# Patient Record
Sex: Male | Born: 1940 | Race: Black or African American | Hispanic: No | Smoking: Former smoker
Health system: Southern US, Community
[De-identification: ages and names within clinical notes are randomized; demographics above are authoritative.]

## PROBLEM LIST (undated history)

## (undated) DIAGNOSIS — I4891 Unspecified atrial fibrillation: Secondary | ICD-10-CM

## (undated) DIAGNOSIS — I2699 Other pulmonary embolism without acute cor pulmonale: Secondary | ICD-10-CM

## (undated) DIAGNOSIS — J439 Emphysema, unspecified: Secondary | ICD-10-CM

## (undated) DIAGNOSIS — R7989 Other specified abnormal findings of blood chemistry: Secondary | ICD-10-CM

## (undated) DIAGNOSIS — I10 Essential (primary) hypertension: Secondary | ICD-10-CM

## (undated) DIAGNOSIS — M109 Gout, unspecified: Secondary | ICD-10-CM

## (undated) DIAGNOSIS — R778 Other specified abnormalities of plasma proteins: Secondary | ICD-10-CM

## (undated) DIAGNOSIS — R911 Solitary pulmonary nodule: Secondary | ICD-10-CM

## (undated) DIAGNOSIS — R55 Syncope and collapse: Secondary | ICD-10-CM

## (undated) HISTORY — DX: Solitary pulmonary nodule: R91.1

## (undated) HISTORY — DX: Emphysema, unspecified: J43.9

## (undated) HISTORY — DX: Syncope and collapse: R55

## (undated) HISTORY — DX: Other specified abnormalities of plasma proteins: R77.8

## (undated) HISTORY — DX: Unspecified atrial fibrillation: I48.91

## (undated) HISTORY — PX: KNEE SURGERY: SHX244

## (undated) HISTORY — DX: Other specified abnormal findings of blood chemistry: R79.89

## (undated) HISTORY — DX: Other pulmonary embolism without acute cor pulmonale: I26.99

---

## 1991-05-01 HISTORY — PX: BACK SURGERY: SHX140

## 2012-12-31 ENCOUNTER — Encounter (HOSPITAL_COMMUNITY): Payer: Self-pay

## 2012-12-31 ENCOUNTER — Emergency Department (HOSPITAL_COMMUNITY): Payer: Medicare Other

## 2012-12-31 ENCOUNTER — Inpatient Hospital Stay (HOSPITAL_COMMUNITY)
Admission: EM | Admit: 2012-12-31 | Discharge: 2013-01-02 | DRG: 308 | Disposition: A | Discharge status: Home / Self Care | Payer: Medicare Other | Attending: Internal Medicine | Admitting: Internal Medicine

## 2012-12-31 DIAGNOSIS — Z87891 Personal history of nicotine dependence: Secondary | ICD-10-CM

## 2012-12-31 DIAGNOSIS — M109 Gout, unspecified: Secondary | ICD-10-CM | POA: Diagnosis present

## 2012-12-31 DIAGNOSIS — I2699 Other pulmonary embolism without acute cor pulmonale: Secondary | ICD-10-CM | POA: Diagnosis present

## 2012-12-31 DIAGNOSIS — Z79899 Other long term (current) drug therapy: Secondary | ICD-10-CM

## 2012-12-31 DIAGNOSIS — D491 Neoplasm of unspecified behavior of respiratory system: Secondary | ICD-10-CM | POA: Diagnosis present

## 2012-12-31 DIAGNOSIS — I4891 Unspecified atrial fibrillation: Principal | ICD-10-CM | POA: Diagnosis present

## 2012-12-31 DIAGNOSIS — Z7982 Long term (current) use of aspirin: Secondary | ICD-10-CM

## 2012-12-31 DIAGNOSIS — Z23 Encounter for immunization: Secondary | ICD-10-CM

## 2012-12-31 DIAGNOSIS — D381 Neoplasm of uncertain behavior of trachea, bronchus and lung: Secondary | ICD-10-CM | POA: Diagnosis present

## 2012-12-31 DIAGNOSIS — R7989 Other specified abnormal findings of blood chemistry: Secondary | ICD-10-CM

## 2012-12-31 DIAGNOSIS — J439 Emphysema, unspecified: Secondary | ICD-10-CM

## 2012-12-31 DIAGNOSIS — R911 Solitary pulmonary nodule: Secondary | ICD-10-CM

## 2012-12-31 DIAGNOSIS — J438 Other emphysema: Secondary | ICD-10-CM | POA: Diagnosis present

## 2012-12-31 DIAGNOSIS — R55 Syncope and collapse: Secondary | ICD-10-CM | POA: Diagnosis present

## 2012-12-31 DIAGNOSIS — I10 Essential (primary) hypertension: Secondary | ICD-10-CM | POA: Diagnosis present

## 2012-12-31 HISTORY — DX: Gout, unspecified: M10.9

## 2012-12-31 HISTORY — DX: Essential (primary) hypertension: I10

## 2012-12-31 LAB — CBC
MCV: 82.5 fL (ref 78.0–100.0)
Platelets: 256 10*3/uL (ref 150–400)
RBC: 5.04 MIL/uL (ref 4.22–5.81)
WBC: 15.2 10*3/uL — ABNORMAL HIGH (ref 4.0–10.5)

## 2012-12-31 LAB — TSH: TSH: 2.398 u[IU]/mL (ref 0.350–4.500)

## 2012-12-31 LAB — MAGNESIUM: Magnesium: 2.2 mg/dL (ref 1.5–2.5)

## 2012-12-31 LAB — RAPID URINE DRUG SCREEN, HOSP PERFORMED
Benzodiazepines: NOT DETECTED
Cocaine: NOT DETECTED

## 2012-12-31 LAB — POCT I-STAT TROPONIN I: Troponin i, poc: 0.34 ng/mL (ref 0.00–0.08)

## 2012-12-31 LAB — BASIC METABOLIC PANEL
CO2: 24 mEq/L (ref 19–32)
Chloride: 110 mEq/L (ref 96–112)
Sodium: 145 mEq/L (ref 135–145)

## 2012-12-31 LAB — TROPONIN I: Troponin I: 0.6 ng/mL (ref <0.30)

## 2012-12-31 LAB — PROTIME-INR: INR: 1.01 (ref 0.00–1.49)

## 2012-12-31 MED ORDER — MULTI-VITAMIN/MINERALS PO TABS: 2.0000 | ORAL_TABLET | Freq: Every day | ORAL | Status: DC

## 2012-12-31 MED ORDER — HEPARIN (PORCINE) IN NACL 100-0.45 UNIT/ML-% IJ SOLN
1600.0000 [IU]/h | INTRAMUSCULAR | Status: DC
  Administered 2012-12-31: 1400 [IU]/h via INTRAVENOUS
  Administered 2013-01-01 (×2): 1600 [IU]/h via INTRAVENOUS
  Filled 2012-12-31 (×4): qty 250

## 2012-12-31 MED ORDER — SODIUM CHLORIDE 0.9 % IJ SOLN
3.0000 mL | Freq: Two times a day (BID) | INTRAMUSCULAR | Status: DC
  Administered 2013-01-01: 3 mL via INTRAVENOUS

## 2012-12-31 MED ORDER — LISINOPRIL 10 MG PO TABS
10.0000 mg | ORAL_TABLET | Freq: Every day | ORAL | Status: DC
  Administered 2013-01-01 – 2013-01-02 (×2): 10 mg via ORAL
  Filled 2012-12-31 (×2): qty 1

## 2012-12-31 MED ORDER — IOHEXOL 300 MG/ML  SOLN
25.0000 mL | INTRAMUSCULAR | Status: AC
  Administered 2012-12-31 (×2): 25 mL via ORAL

## 2012-12-31 MED ORDER — ONDANSETRON HCL 4 MG/2ML IJ SOLN
4.0000 mg | Freq: Three times a day (TID) | INTRAMUSCULAR | Status: AC | PRN
Start: 2012-12-31 — End: 2013-01-01

## 2012-12-31 MED ORDER — HYDROMORPHONE HCL PF 1 MG/ML IJ SOLN: 1.0000 mg | INTRAMUSCULAR | Status: AC | PRN

## 2012-12-31 MED ORDER — ADULT MULTIVITAMIN W/MINERALS CH
1.0000 | ORAL_TABLET | Freq: Every day | ORAL | Status: DC
  Administered 2013-01-01 – 2013-01-02 (×2): 1 via ORAL
  Filled 2012-12-31 (×2): qty 1

## 2012-12-31 MED ORDER — DILTIAZEM HCL 30 MG PO TABS
30.0000 mg | ORAL_TABLET | Freq: Four times a day (QID) | ORAL | Status: DC
  Administered 2012-12-31 – 2013-01-01 (×2): 30 mg via ORAL
  Filled 2012-12-31 (×6): qty 1

## 2012-12-31 MED ORDER — IOHEXOL 350 MG/ML SOLN
100.0000 mL | Freq: Once | INTRAVENOUS | Status: AC | PRN
  Administered 2012-12-31: 100 mL via INTRAVENOUS

## 2012-12-31 MED ORDER — HEPARIN BOLUS VIA INFUSION
4500.0000 [IU] | Freq: Once | INTRAVENOUS | Status: AC
  Administered 2012-12-31: 4500 [IU] via INTRAVENOUS
  Filled 2012-12-31: qty 4500

## 2012-12-31 MED ORDER — ASPIRIN EC 325 MG PO TBEC
325.0000 mg | DELAYED_RELEASE_TABLET | Freq: Every day | ORAL | Status: DC
  Administered 2012-12-31: 325 mg via ORAL
  Filled 2012-12-31 (×2): qty 1

## 2012-12-31 NOTE — ED Notes (Signed)
Report called to the floor.

## 2012-12-31 NOTE — ED Notes (Signed)
The pt just returned from xray 

## 2012-12-31 NOTE — ED Notes (Signed)
The pt has gone to xray 

## 2012-12-31 NOTE — ED Notes (Signed)
Patient transported to CT 

## 2012-12-31 NOTE — ED Notes (Addendum)
Per [facility], pt at work, got dehydrated, found in Breakroom unresponsive. Diaphoretic with BP 70/40, absent radial pulses, HR > 150. Afib on the monitor. 18g to RAC and cardizem gtt started. 1 Liter infused. Pt alert and oriented at this time. Also given 20 mg cardizem bolus to help rate and BP. Cardizem gtt running at 5mg / hr

## 2012-12-31 NOTE — Progress Notes (Signed)
ANTICOAGULATION CONSULT NOTE - Initial Consult  Pharmacy Consult for heparin Indication: pulmonary embolus  No Known Allergies  Patient Measurements:   Heparin Dosing Weight: 90.9kg  Vital Signs: Temp: 98.5 F (36.9 C) (09/03 1747) Temp src: Oral (09/03 1529) BP: 148/73 mmHg (09/03 2005) Pulse Rate: 80 (09/03 2005)  Labs:  Recent Labs  12/31/12 1652  HGB 14.4  HCT 41.6  PLT 256  CREATININE 1.29    CrCl is unknown because there is no height on file for the current visit.   Medical History: Past Medical History  Diagnosis Date  . Hypertension   . Gout     Medications:  Infusions:  . heparin    . heparin      Assessment: 72 yom presented to the ED with syncope. CT demonstrated a PE. Also lung masses. To start IV heparin for anticoagulation. Baseline CBC is WNL. He is not on any anticoagulants PT. Dosing based on patients stated height/weight of 71in/200lbs but pt has states he hasn't weight himself in awhile.   Goal of Therapy:  Heparin level 0.3-0.7 units/ml Monitor platelets by anticoagulation protocol: Yes   Plan:  1. Heparin bolus 4500 units IV x 1 2. Heparin gtt 1400 units/hr 3. Check an 8 hour heparin level 4. Daily heparin level and CBC 5. F/u inpatient measured weight 6. F/u start of oral anticoagulation  Corinn Stoltzfus, Drake Leach 12/31/2012,10:02 PM

## 2012-12-31 NOTE — ED Provider Notes (Signed)
CSN: [id number]     Arrival date & time 12/31/12  1457 History   None    No chief complaint on file.  (Consider location/radiation/quality/duration/timing/severity/associated sxs/prior Treatment) Patient is a 72 y.o. male presenting with syncope. The history is provided by the patient. No language interpreter was used.  Loss of Consciousness Episode history:  Single Most recent episode:  Today Timing:  Rare Progression:  Resolved Chronicity:  New Context: dehydration   Witnessed: yes   Relieved by:  Nothing Worsened by:  Nothing tried Ineffective treatments:  None tried Associated symptoms: no chest pain, no fever, no headaches, no nausea, no recent fall, no seizures, no shortness of breath and no vomiting   Associated symptoms comment:  New onset afib Risk factors: no coronary artery disease and no seizures     No past medical history on file. No past surgical history on file. No family history on file. History  Substance Use Topics  . Smoking status: Not on file  . Smokeless tobacco: Not on file  . Alcohol Use: Not on file    Review of Systems  Constitutional: Negative for fever.  HENT: Negative for congestion, sore throat and rhinorrhea.   Respiratory: Negative for cough and shortness of breath.   Cardiovascular: Positive for syncope. Negative for chest pain.  Gastrointestinal: Negative for nausea, vomiting, abdominal pain and diarrhea.  Genitourinary: Negative for dysuria and hematuria.  Skin: Negative for rash.  Neurological: Positive for syncope. Negative for seizures, light-headedness and headaches.  All other systems reviewed and are negative.    Allergies  Review of patient's allergies indicates not on file.  Home Medications  No current outpatient prescriptions on file. BP 103/63  Pulse 92  Temp(Src) 98.5 F (36.9 C) (Oral)  Resp 16  SpO2 99% Physical Exam  Nursing note and vitals reviewed. Constitutional: He is oriented to person, place, and time.  He appears well-developed and well-nourished.  HENT:  Head: Normocephalic and atraumatic.  Right Ear: External ear normal.  Left Ear: External ear normal.  Eyes: EOM are normal.  Neck: Normal range of motion. Neck supple.  Cardiovascular: Normal heart sounds and intact distal pulses.  Exam reveals no gallop and no friction rub.   No murmur heard. irreg irreg  Pulmonary/Chest: Effort normal and breath sounds normal. No respiratory distress. He has no wheezes. He has no rales. He exhibits no tenderness.  Abdominal: Soft. Bowel sounds are normal. He exhibits no distension. There is no tenderness. There is no rebound.  Musculoskeletal: Normal range of motion. He exhibits no edema and no tenderness.  Lymphadenopathy:    He has no cervical adenopathy.  Neurological: He is alert and oriented to person, place, and time. He has normal reflexes. He displays normal reflexes. No cranial nerve deficit. He exhibits normal muscle tone. Coordination normal.  Skin: Skin is warm. No rash noted.  Psychiatric: He has a normal mood and affect. His behavior is normal.    ED Course  Procedures (including critical care time) Labs Review Labs Reviewed  CBC - Abnormal; Notable for the following:    WBC 15.2 (*)    All other components within normal limits  BASIC METABOLIC PANEL - Abnormal; Notable for the following:    GFR calc non Af Amer 55 (*)    GFR calc Af Amer 63 (*)    All other components within normal limits  TROPONIN I - Abnormal; Notable for the following:    Troponin I 0.60 (*)    All other components  within normal limits  POCT I-STAT TROPONIN I - Abnormal; Notable for the following:    Troponin i, poc 0.11 (*)    All other components within normal limits  POCT I-STAT TROPONIN I - Abnormal; Notable for the following:    Troponin i, poc 0.34 (*)    All other components within normal limits  MAGNESIUM  TSH  URINE RAPID DRUG SCREEN (HOSP PERFORMED)  PROTIME-INR  HEPARIN LEVEL  (UNFRACTIONATED)  CBC  CBC  BASIC METABOLIC PANEL  TROPONIN I  TROPONIN I  TSH   Imaging Review Dg Chest 2 View  12/31/2012   *RADIOLOGY REPORT*  Clinical Data: The atrial fibrillation.  Diaphoresis  CHEST - 2 VIEW  Comparison: None.  Findings: Right lung is clear.  3.4 cm mass like opacity overlies the left midlung. The cardiopericardial silhouette is within normal limits for size. Imaged bony structures of the thorax are intact.  IMPRESSION: 3.4 cm mass-like opacity overlying the left mid lung, concerning for neoplasm.  CT chest recommended to further evaluate.  I personally called the results of this study to Dr. Modesto Charon at approximately 1600 hours on 12/31/2012.   Original Report Authenticated By: Kennith Center, M.D.    MDM  No diagnosis found. 2:57 PM Pt is a 72 y.o. male with pertinent PMHX of HTN who presents to the ED with syncopal episode and new onset afib. Pt presents with syncopal episode at around [facility]. Preceding aura:. Not similar to previous episodes. Pt had no symptoms prior to syncopal episode. Pt denies chest pain, palpitations,lightheadedness. Pt denies focal neurologic deficits.  Given 20mg  Diltiazem prior to arrival and started on drip. Denies illicit drug abuse. Denies stimulant use. 2-3 caffeinated beverages a day. No stimulant use. No family history of sudden death or arrythmia. No recent illness. Used to smoke.  On Exam: AFVSS, irreg irreg rhythm Neurologic exam: Fundoscopic exam: CN I-XII: grossly intact, Sensation: normal in upper and lower extremities, Strength 5/5 in both upper and lower extremities, Coordination intact. Gait normal. Pt's HR<100 will stop drip. Pressure stable. Plan for EKG, CXr, CBC, BMP, istat troponin, UDS  EKG personally reviewed by myself showed afib, incomplete RBBB, LAFB Rate of 77, PR NAms, QRS QT/QTC 358/427ms, left axis, without evidence of new ischemia. No Comparison, indication: new onset afib  CXR PA/LAT for new on set afib per my read  showed well rounded mass in left lung suspicious for malignancy given history of tobacco abuse, otherwise no cardiomegaly no ptx or pneumonia. Given suspicion for malignancy, high risk for PE which is possible cause of syncope. Will obtain CTA PE study if GFR allows  Pt noted to be back into sinus rhythm, Will have cardiology come to evaluate pt for new onset afib.  Review of labs: istat troponin mild elevation. UDS negative. CBC showed leukocytosis, H&H 14.4/41.6. BMP showed no electrolyte abnormalities. Will obtain CTA Pe study and CT abdomen pelvis w contrast. Mag 2.2.   The patient appears reasonably stabilized for admission considering the current resources, flow, and capabilities available in the ED at this time, and I doubt any other [facility] requiring further screening and/or treatment in the ED prior to admission.   Plan for admission to hospitalist for further evaluation and management. PT transferred to step down VSS.  CTA PE study showed small pulmonary embolus to left upper lobe. 2 spiculated lesions in the left upper lobe suspicious for malignancy. CT abdomen pelvis w contrast showed small lucent lesions in bony pelvis, lymphadenopathy in the gastrohepatic ligament,  cholelithiasis and left renal cyst  Second iStat troponin 0.34. Plan for admission to hospitalist for further cancer work up with cardiology following for new onset afib and NSTEMI.  The patient appears reasonably stabilized for admission considering the current resources, flow, and capabilities available in the ED at this time, and I doubt any other [facility] requiring further screening and/or treatment in the ED prior to admission.   Plan for admission to Hospitalist for further evaluation and management. PT transferred to step down VSS.  Labs, EKG and imaging reviewed by myself and considered in medical decision making if ordered.  Imaging interpreted by radiology. Pt was discussed with my attending, Dr. Erlene Quan, MD 01/01/13 [phone]

## 2012-12-31 NOTE — Progress Notes (Signed)
Pt transfered from ED with RN, on monitor. Pt Ax4, able to move all ext, and VSS. Pt on heparin gtt. Will continue to monitor.

## 2012-12-31 NOTE — ED Notes (Signed)
Cards pa here to see 

## 2012-12-31 NOTE — ED Notes (Signed)
Patient returned from CT and placed back on cardiac monitor.

## 2012-12-31 NOTE — ED Notes (Signed)
The pts son Randy Mcgee number [phone].  Other numbers attached to the pts chart

## 2012-12-31 NOTE — ED Notes (Signed)
Dr Jens Som in to see the pt

## 2012-12-31 NOTE — H&P (Signed)
[facility] History and Physical  Randy Mcgee ZOX:[id number] DOB: 11-Mar-1941 DOA: 12/31/2012  Referring physician: ED PCP: Pcp Not In System   Chief Complaint: Syncope  HPI: Randy Mcgee is a 72 y.o. male who presents after an episode of syncope.  He was in his usual state of health today, unloading a tractor-trailer, and the next thing he knew, he woke up in the ambulance on the way to the hospital.  There was no prodrome other than perhaps a small amount of dizziness earlier today.  EMS found him to be in A.fib with RVR, SBP of 70, after a liter of NS and Cardizem 20mg  x1, he spontaneously converted upon arrival to the ER.  He has no history of A.Fib in the past.  In the ED, in addition to his A.Fib (for which cards has seen the patient), work up has also demonstrated: mildly elevated troponin of 0.11 which has since elevated to 0.34, 2 lung masses in the left upper lobe which appear to be spiculated and malignant appearing, and incidental findings of small PE on the CT scan which was performed to visualize the lung masses.  Hospitalist is admitting.  Review of Systems: 12 systems reviewed and otherwise negative.  Past Medical History  Diagnosis Date  . Hypertension   . Gout    Past Surgical History  Procedure Laterality Date  . Back surgery  1993  . Knee surgery Bilateral    Social History:  reports that he has quit smoking. He does not have any smokeless tobacco history on file. He reports that he does not drink alcohol or use illicit drugs.   No Known Allergies  History reviewed. No pertinent family history.  Prior to Admission medications   Medication Sig Start Date End Date Taking? Authorizing Provider  lisinopril (PRINIVIL,ZESTRIL) 10 MG tablet Take 10 mg by mouth daily.   Yes Historical Provider, MD  Multiple Vitamins-Minerals (MULTIVITAMIN WITH MINERALS) tablet Take 2 tablets by mouth daily.   Yes Historical Provider, MD   Physical Exam: Filed Vitals:    12/31/12 2005  BP: 148/73  Pulse: 80  Temp:   Resp: 18    General:  NAD, resting comfortably in bed Eyes: PEERLA EOMI ENT: mucous membranes moist Neck: supple w/o JVD Cardiovascular: RRR w/o MRG Respiratory: CTA B Abdomen: soft, nt, nd, bs+ Skin: no rash nor lesion Musculoskeletal: MAE, full ROM all 4 extremities Psychiatric: normal tone and affect Neurologic: AAOx3, grossly non-focal  Labs on Admission:  Basic Metabolic Panel:  Recent Labs Lab 12/31/12 1652  NA 145  K 4.2  CL 110  CO2 24  GLUCOSE 99  BUN 16  CREATININE 1.29  CALCIUM 9.9  MG 2.2   Liver Function Tests: No results found for this basename: AST, ALT, ALKPHOS, BILITOT, PROT, ALBUMIN,  in the last 168 hours No results found for this basename: LIPASE, AMYLASE,  in the last 168 hours No results found for this basename: AMMONIA,  in the last 168 hours CBC:  Recent Labs Lab 12/31/12 1652  WBC 15.2*  HGB 14.4  HCT 41.6  MCV 82.5  PLT 256   Cardiac Enzymes: No results found for this basename: CKTOTAL, CKMB, CKMBINDEX, TROPONINI,  in the last 168 hours  BNP (last 3 results) No results found for this basename: PROBNP,  in the last 8760 hours CBG: No results found for this basename: GLUCAP,  in the last 168 hours  Radiological Exams on Admission: Dg Chest 2 View  12/31/2012   *RADIOLOGY REPORT*  Clinical Data: The atrial fibrillation.  Diaphoresis  CHEST - 2 VIEW  Comparison: None.  Findings: Right lung is clear.  3.4 cm mass like opacity overlies the left midlung. The cardiopericardial silhouette is within normal limits for size. Imaged bony structures of the thorax are intact.  IMPRESSION: 3.4 cm mass-like opacity overlying the left mid lung, concerning for neoplasm.  CT chest recommended to further evaluate.  I personally called the results of this study to Dr. Modesto Charon at approximately 1600 hours on 12/31/2012.   Original Report Authenticated By: Kennith Center, M.D.   Ct Angio Chest Pe W/cm &/or Wo  Cm  12/31/2012   *RADIOLOGY REPORT*  Clinical Data: Diaphoresis with hypotension.  Question pulmonary embolus.  CT ANGIOGRAPHY CHEST  Technique:  Multidetector CT imaging of the chest using the standard protocol during bolus administration of intravenous contrast. Multiplanar reconstructed images including MIPs were obtained and reviewed to evaluate the vascular anatomy.  Contrast: OMNIPAQUE IOHEXOL 350 MG/ML SOLN  Comparison: None.  Findings: Small pulmonary emboli are seen in segmental and subsegmental pulmonary arteries to the lingula no evidence for pulmonary embolic disease in the right lung.  No thoracic aortic aneurysm.  No evidence for a dissection flap in the thoracic aorta.  No axillary lymphadenopathy.  No mediastinal lymphadenopathy.  No bulky lymphadenopathy in either hilum although there is some lymphoid tissue noted in the left hilum.  Lung windows reveal a 2.0 cm spiculated nodule in the anteromedial left upper lobe.  There may be a tiny focus of cavitation within this nodule.  A second 3.1 x 2.6 cm pulmonary spiculated mass is identified in the posterior left upper lobe.  Emphysema is noted in the upper lobes.  There is some compressive atelectasis in the dependent lower lobes bilaterally.  Bone windows reveal no worrisome lytic or sclerotic osseous lesions.  IMPRESSION: Tiny nonocclusive pulmonary embolus and branches of the segmental and subsegmental pulmonary artery to the left upper lobe.  Two spiculated lesions in the left upper lobe measuring 2.0 and 3.1 cm in maximum dimensions, respectively.  Imaging features are highly suspicious for neoplasm although it is unclear whether they represent  synchronous lung primaries or possible metastatic involvement.  PET CT may prove helpful to further evaluate.  Critical Value/emergent results were called by telephone at the time of interpretation on 12/31/2012 at 2113 to Dr. Modesto Charon, who verbally acknowledged these results.   Original Report  Authenticated By: Kennith Center, M.D.   Ct Abdomen Pelvis W Contrast  12/31/2012   *RADIOLOGY REPORT*  Clinical Data: Left lung mass.  Evaluate for metastatic disease.  CT ABDOMEN AND PELVIS WITH CONTRAST  Technique:  Multidetector CT imaging of the abdomen and pelvis was performed following the standard protocol during bolus administration of intravenous contrast.  Contrast: OMNIPAQUE IOHEXOL 350 MG/ML SOLN  Comparison: None.  Findings: The liver and spleen are normal.  Stomach, duodenum, pancreas, and adrenal glands are unremarkable.  2.7 cm stone is identified in the fundus of the gallbladder.  Right kidney is unremarkable.  2.6 cm cyst is identified in the upper pole of the left kidney with a second smaller 1.2 cm cyst visible.  No abdominal aortic aneurysm although atherosclerotic calcification is noted in the wall of the abdominal aorta.  The 1.7 cm gastrohepatic lymph node is mildly enlarged.  No hepatoduodenal ligament lymphadenopathy.  No free fluid in the abdomen.  Imaging through the pelvis shows no free intraperitoneal fluid. There is no pelvic sidewall lymphadenopathy.  Bladder is unremarkable.  Prostate gland is mildly enlarged.  No substantial diverticular change in the colon.  There is no colonic diverticulitis.  The terminal ileum is normal.  The appendix is normal.  Bilateral inguinal hernias contain only fat.  Multiple small lucent lesions are seen within the bony anatomic pelvis.  Advanced degenerative changes are seen at L4-5 and L5-S1.  IMPRESSION: Borderline lymphadenopathy in the gastrohepatic ligament.  Small lucent lesions in the bony anatomic pelvis.  While nonspecific, metastatic involvement is not excluded.  Cholelithiasis.  Left renal cysts.   Original Report Authenticated By: Kennith Center, M.D.    EKG: Independently reviewed.  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 Neoplasm of LUL of lung - 2 masses, unclear wether these are 2 concurrent primaries vs local metastasis, patient likely will need biopsy / resection, likely needs CTS evaluation tomorrow vs IR for biopsy. 2. A.Fib - currently in NSR, on heparin gtt for PEs, have not yet ordered coumadin on this patient since it is likely he will be undergoing procedure to biopsy lung mass(s). 3. PE - see #2 above, very small clot burden 4. Elevated troponin - cards on board, checking serial trops, cards to determine if patient needs heart cath (they said he likely would in their note if this continues to elevate).    Code Status: Full Code (must indicate code status--if unknown or must be presumed, indicate so) Family Communication: No family in room (indicate person spoken with, if applicable, with phone number if by telephone) Disposition Plan: Admit to inpatient (indicate anticipated LOS)  Time spent: 70 min  GARDNER, JARED M. [facility] Pager [phone]  If 7PM-7AM, please contact night-coverage [email or web address] Password [facility] 12/31/2012, 10:37 PM

## 2012-12-31 NOTE — ED Notes (Signed)
cardizem drip d/c per dr [name]

## 2012-12-31 NOTE — ED Notes (Signed)
No pain nsr on the monitor.  Waiting for the admitting doctor to arrive

## 2012-12-31 NOTE — ED Notes (Signed)
The pt has been waiting for a c-t told  bt the ed res.  labwork had to come back before he could be scanned.  Son at his bedside the pt is hungry.  He reports that he only eats x 2 per day

## 2012-12-31 NOTE — ED Notes (Signed)
The pt just returned from xray.  No pain or discooomfort

## 2012-12-31 NOTE — ED Notes (Signed)
The pt is still drinking oral contrast.  He is c/o being cold room temp adjusted

## 2012-12-31 NOTE — Consult Note (Signed)
CARDIOLOGY CONSULT NOTE   Patient ID: Randy Mcgee MRN: [id number] DOB/AGE: [birth year] 72 y.o.  Admit date: 12/31/2012  Primary Physician   Randy Mcgee in [facility] Primary Cardiologist   Randy Mcgee   Syncope, PAF/RVR, anticoagulation, possible pre-op eval, elevated enzymes  Randy Mcgee is a 72 y.o. male with no history of CAD. He was in his usual state of health today and was working, unloading a Randy Mcgee. He was doing pretty well and then woke up in the ambulance.  Earlier today, he had noticed some dizziness when he bent over and came back up. When he passed out, he bent over and had the dizziness but no other prodrome and no presyncope. He was not aware he was going to pass out. EMS found him to be in atrial fibrillation with RVR, SBP 70. He was given a liter of saline and Cardizem 20 mg IV x 1. Upon arrival to the ER, he spontaneously converted to sinus rhythm. He is currently resting comfortably.   He had no symptoms or signs before the syncope except diaphoresis and his feet were tired. When he woke up, he was still diaphoretic but felt fairly normal. He never had chest pain, palpitations, SOB. He had no awareness of tachycardia. Today, he had 2 cups of coffee, 2 -16 oz sodas and 1 glass of water. He has had rare episodes of brief palpitations, no symptoms with them, none recently. He is not aware of any bleeding from any source. He has no recent illnesses or problems.   Past Medical History  Diagnosis Date  . Hypertension   . Gout     Past Surgical History  Procedure Laterality Date  . Back surgery  1993  . Knee surgery Bilateral     No Known Allergies  I have reviewed the patient's current medications . [COMPLETED] iohexol  25 mL Oral Q1 Hr x 2   Medication Sig  lisinopril (PRINIVIL,ZESTRIL) 10 MG tablet Take 10 mg by mouth daily.  Multiple Vitamins-Minerals (MULTIVITAMIN WITH MINERALS) tablet Take 2 tablets by mouth daily.    History    Social History  . Marital Status: Widowed    Spouse Name: N/A    Number of Children: N/A  . Years of Education: N/A   Occupational History  . Company secretary now, Retired from [employer]    Social History Main Topics  . Smoking status: Former Smoker -- 0.50 packs/day for 45 years  . Smokeless tobacco: Not on file     Comment: Quit 2011, less than 1/2 PPD  . Alcohol Use: No     Comment: None in > 25 years  . Drug Use: No  . Sexual Activity: Not on file   Other Topics Concern  . Not on file   Social History Narrative  . Pt lives with youngest son and niece.     Family Status  Relation Status Death Age  . Brother Deceased 52    Cancer  . Father Deceased 27    Old age  . Mother Deceased 61    Rare form of adult-onset Leukemia   . Other      Uncle with Prostate CA    ROS: Occasional GI discomfort from specific foods. He takes vitamins and BP meds daily. He drinks juice and eats well. He generally does not get sick. Full 14 point review of systems complete and found to be negative unless listed above.  Physical Exam: Blood pressure 130/61, pulse 74, temperature  98.5 F (36.9 C), temperature source Oral, resp. rate 18, SpO2 100.00%.  General: Well developed, well nourished, male in no acute distress Head: Eyes PERRLA, No xanthomas.   Normocephalic and atraumatic, oropharynx without edema or exudate.  Lungs: CTA with normal expansion Heart: HRRR S1 S2, no rub/gallop, murmur. pulses are 2+ all 4 extrem.   Neck: No carotid bruits. No lymphadenopathy.  JVD not elevated. Abdomen: Bowel sounds present, abdomen soft and non-tender without masses or hernias noted. Msk:  No spine or cva tenderness. No weakness, no joint deformities or effusions. Extremities: No clubbing or cyanosis. No edema.  Neuro: Alert and oriented X 3. No focal deficits noted. Psych:  Good affect, responds appropriately Skin: No rashes or lesions noted.  Labs:   Lab Results  Component Value  Date   WBC 15.2* 12/31/2012   HGB 14.4 12/31/2012   HCT 41.6 12/31/2012   MCV 82.5 12/31/2012   PLT 256 12/31/2012     Recent Labs Lab 12/31/12 1652  NA 145  K 4.2  CL 110  CO2 24  BUN 16  CREATININE 1.29  CALCIUM 9.9  GLUCOSE 99   Magnesium  Date Value Range Status  12/31/2012 2.2  1.5 - 2.5 mg/dL Final    Recent Labs  16/10/96 1709  TROPIPOC 0.11*    ECG:   EMS rhythm strips with   Radiology:  Dg Chest 2 View  12/31/2012   *RADIOLOGY REPORT*  Clinical Data: The atrial fibrillation.  Diaphoresis  CHEST - 2 VIEW  Comparison: None.  Findings: Right lung is clear.  3.4 cm mass like opacity overlies the left midlung. The cardiopericardial silhouette is within normal limits for size. Imaged bony structures of the thorax are intact.  IMPRESSION: 3.4 cm mass-like opacity overlying the left mid lung, concerning for neoplasm.  CT chest recommended to further evaluate.  I personally called the results of this study to Randy. Modesto Charon at approximately 1600 hours on 12/31/2012.   Original Report Authenticated By: Kennith Center, M.D.    ASSESSMENT AND PLAN:    As above, patient seen and examined. Briefly he is a 72 year old male with atrial fibrillation. He has no prior cardiac history and typically does not have dyspnea on exertion, orthopnea, PND, pedal edema, palpitations, syncope or exertional chest pain. The patient states he was working hard today unloading a truck. He was sweating profusely. He had 2 episodes of dizziness after bending over and then standing up. He then over and stood up and recalls nothing until being placed in an ambulance. He had no chest pain, palpitations, nausea or dyspnea prior to his syncopal episode. No seizure activity, incontinence. Troponin I 0.11. Electrocardiogram shows atrial fibrillation, RV conduction delay and left axis deviation. Chest x-ray shows lung mass. 1 syncope-patient symptoms sound to be orthostatic mediated. Plan to admit and follow on telemetry. Check  echocardiogram for LV function. If LV function normal would not plan further workup unless he has recurrent symptoms in the future. 2 atrial fibrillation-the patient was in atrial fibrillation on presentation. Check echocardiogram for LV function and TSH. His CHADS score is 1 for hypertension. If LV function is normal I would favor aspirin long-term. I would be hesitant to anticoagulate at this point given lung mass on chest x-ray and recent syncopal episode. We'll treat with Cardizem for rate control (Began 30 mg by mouth every 6 hours and transition to Cardizem CD tomorrow morning if heart rate and blood pressure stable). Note he did convert to sinus rhythm in  the emergency room. 3 hypertension-monitor blood pressure and adjust medications as needed. 4 lung mass-there is concern for lung cancer. A chest CT has been ordered. I will leave this issue to primary care. 5 elevated troponin I-troponin is mildly elevated. This may be from his atrial fibrillation. Would treat with aspirin. Continue to cycle enzymes. If there is further trend up he may need cardiac catheterization. Otherwise I would plan nuclear study for risk stratification.  Signed: Olga Millers MD

## 2013-01-01 ENCOUNTER — Encounter (HOSPITAL_COMMUNITY): Payer: Self-pay | Admitting: Adult Health

## 2013-01-01 DIAGNOSIS — J439 Emphysema, unspecified: Secondary | ICD-10-CM

## 2013-01-01 DIAGNOSIS — I4891 Unspecified atrial fibrillation: Secondary | ICD-10-CM

## 2013-01-01 DIAGNOSIS — R55 Syncope and collapse: Secondary | ICD-10-CM | POA: Diagnosis present

## 2013-01-01 DIAGNOSIS — R911 Solitary pulmonary nodule: Secondary | ICD-10-CM

## 2013-01-01 DIAGNOSIS — J438 Other emphysema: Secondary | ICD-10-CM

## 2013-01-01 LAB — BASIC METABOLIC PANEL
CO2: 25 mEq/L (ref 19–32)
Calcium: 9.1 mg/dL (ref 8.4–10.5)
Chloride: 106 mEq/L (ref 96–112)
Glucose, Bld: 96 mg/dL (ref 70–99)
Sodium: 141 mEq/L (ref 135–145)

## 2013-01-01 LAB — TSH: TSH: 2.639 u[IU]/mL (ref 0.350–4.500)

## 2013-01-01 LAB — TROPONIN I: Troponin I: 0.45 ng/mL (ref <0.30)

## 2013-01-01 LAB — CBC
Hemoglobin: 12.6 g/dL — ABNORMAL LOW (ref 13.0–17.0)
MCH: 26.8 pg (ref 26.0–34.0)
MCV: 83.2 fL (ref 78.0–100.0)
RBC: 4.71 MIL/uL (ref 4.22–5.81)
WBC: 11.8 10*3/uL — ABNORMAL HIGH (ref 4.0–10.5)

## 2013-01-01 LAB — HEPARIN LEVEL (UNFRACTIONATED): Heparin Unfractionated: 0.19 IU/mL — ABNORMAL LOW (ref 0.30–0.70)

## 2013-01-01 MED ORDER — ASPIRIN EC 81 MG PO TBEC
81.0000 mg | DELAYED_RELEASE_TABLET | Freq: Every day | ORAL | Status: DC
  Administered 2013-01-01 – 2013-01-02 (×2): 81 mg via ORAL
  Filled 2013-01-01 (×2): qty 1

## 2013-01-01 MED ORDER — WARFARIN SODIUM 7.5 MG PO TABS
7.5000 mg | ORAL_TABLET | Freq: Once | ORAL | Status: AC
  Administered 2013-01-01: 7.5 mg via ORAL
  Filled 2013-01-01: qty 1

## 2013-01-01 MED ORDER — WARFARIN - PHARMACIST DOSING INPATIENT: Freq: Every day | Status: DC

## 2013-01-01 MED ORDER — DILTIAZEM HCL ER COATED BEADS 120 MG PO CP24
120.0000 mg | ORAL_CAPSULE | Freq: Every day | ORAL | Status: DC
  Administered 2013-01-01 – 2013-01-02 (×2): 120 mg via ORAL
  Filled 2013-01-01 (×2): qty 1

## 2013-01-01 MED ORDER — PNEUMOCOCCAL VAC POLYVALENT 25 MCG/0.5ML IJ INJ
0.5000 mL | INJECTION | INTRAMUSCULAR | Status: AC
  Administered 2013-01-02: 0.5 mL via INTRAMUSCULAR
  Filled 2013-01-01: qty 0.5

## 2013-01-01 MED ORDER — HEPARIN BOLUS VIA INFUSION
2500.0000 [IU] | Freq: Once | INTRAVENOUS | Status: AC
  Administered 2013-01-01: 2500 [IU] via INTRAVENOUS
  Filled 2013-01-01: qty 2500

## 2013-01-01 MED ORDER — HEPARIN (PORCINE) IN NACL 100-0.45 UNIT/ML-% IJ SOLN
1900.0000 [IU]/h | INTRAMUSCULAR | Status: DC
  Administered 2013-01-02: 1900 [IU]/h via INTRAVENOUS
  Filled 2013-01-01 (×3): qty 250

## 2013-01-01 MED ORDER — WARFARIN VIDEO: Freq: Once | Status: DC

## 2013-01-01 MED ORDER — PATIENT'S GUIDE TO USING COUMADIN BOOK
Freq: Once | Status: AC
  Administered 2013-01-01: 18:00:00
  Filled 2013-01-01: qty 1

## 2013-01-01 NOTE — Progress Notes (Signed)
RN paged troponin of .61. This has trended up mildly from the first 2. Cardiology has already seen pt and think that it is likely due to Afib. However, per consult note, they are/will consider card cath/stress test. Pt is on Heparin gtt for known PE. Will sign out to attending in am and have cardiology see again in am.  Jimmye Norman, NP [facility]

## 2013-01-01 NOTE — Consult Note (Signed)
PULMONARY  / CRITICAL CARE MEDICINE  Name: Randy Mcgee MRN: [id number] DOB: 1941/02/15    ADMISSION DATE:  12/31/2012 CONSULTATION DATE:  01/01/13  REFERRING MD :  Butler Denmark PRIMARY SERVICE:  [facility]   CHIEF COMPLAINT:  Pulmonary nodules   BRIEF PATIENT DESCRIPTION: 72 y/o M admitted 9/3 after an episode of syncope.  He has known afib and found to have tiny PE's on admit & pulmonary nodules concerning for neoplasm.  PCCM consulted to evaluate nodules.    SIGNIFICANT EVENTS / STUDIES: CT abd/pelvis 9/23>>>Borderline lymphadenopathy in the gastrohepatic ligament. Small lucent lesions in the bony anatomic pelvis. While nonspecific, metastatic involvement is not excluded. Cholelithiasis.Left renal cysts. CTA chest 9/3>>>  Tiny nonocclusive pulmonary embolus and branches of the segmental and subsegmental pulmonary artery to the left upper lobe.  Two spiculated lesions in the left upper lobe measuring 2.0 and 3.1 cm in maximum dimensions, respectively. Imaging features are highly suspicious for neoplasm although it is unclear whether they represent synchronous lung primaries or possible metastatic involvement.  2D echo 9/4>>>   LINES / TUBES: none  CULTURES: none  ANTIBIOTICS: none  HISTORY OF PRESENT ILLNESS:  72 y/o M admitted 9/3 after an episode of syncope. States he was in his usually state of health until 9/3 when he was unloading a tractor trailer.  He felt lightheaded several times while bending over, but this resolved when he stood up straight.  He bent over a 4th time and woke up in the ambulance.  He denies any chest pain or SOB prior to this episode.  Also denies fevers, chills, cough, hemoptysis, leg/calf pain.  He has known afib and found to have tiny PE's on admit & pulmonary nodules concerning for neoplasm.   PAST MEDICAL HISTORY :  Past Medical History  Diagnosis Date  . Hypertension   . Gout    Past Surgical History  Procedure Laterality Date  . Back surgery  1993  .  Knee surgery Bilateral    Prior to Admission medications   Medication Sig Start Date End Date Taking? Authorizing Provider  lisinopril (PRINIVIL,ZESTRIL) 10 MG tablet Take 10 mg by mouth daily.   Yes Historical Provider, MD  Multiple Vitamins-Minerals (MULTIVITAMIN WITH MINERALS) tablet Take 2 tablets by mouth daily.   Yes Historical Provider, MD   No Known Allergies  FAMILY HISTORY:  History reviewed. No pertinent family history. SOCIAL HISTORY:  reports that he quit smoking about 3 years ago. His smoking use included Cigarettes. He started smoking about 54 years ago. He has a 22.5 pack-year smoking history. He does not have any smokeless tobacco history on file. He reports that he does not drink alcohol or use illicit drugs.  REVIEW OF SYSTEMS:   As per HPI - all other systems reviewed and were neg.   VITAL SIGNS: Temp:  [98.2 F (36.8 C)-98.7 F (37.1 C)] 98.5 F (36.9 C) (09/04 0754) Pulse Rate:  [66-92] 68 (09/04 0754) Resp:  [14-18] 14 (09/04 0754) BP: (102-159)/(51-85) 137/80 mmHg (09/04 0754) SpO2:  [98 %-100 %] 98 % (09/04 0754) Weight:  [199 lb 15.3 oz (90.7 kg)] 199 lb 15.3 oz (90.7 kg) (09/03 2331)  PHYSICAL EXAMINATION: General:  Pleasant male, NAD in bed Neuro:  Awake, alert, appropriate, MAE HEENT:  Mm moist, no JVD, no LA Cardiovascular:  s1s2 irreg, no m/r/g Lungs:  resps even non labored on [place], cta  Abdomen:  Soft, +bs Musculoskeletal:  Warm and dry, no edema, - [place]   LABS PULMONARY No results  found for this basename: PHART, PCO2, PCO2ART, PO2, PO2ART, HCO3, TCO2, O2SAT,  in the last 168 hours  CBC  Recent Labs Lab 12/31/12 1652 01/01/13 0405  HGB 14.4 12.6*  HCT 41.6 39.2  WBC 15.2* 11.8*  PLT 256 234    COAGULATION  Recent Labs Lab 12/31/12 2225  INR 1.01    CARDIAC   Recent Labs Lab 12/31/12 2252 01/01/13 0405 01/01/13 1030  TROPONINI 0.60* 0.45* <0.30   No results found for this basename: PROBNP,  in the last 168  hours   CHEMISTRY  Recent Labs Lab 12/31/12 1652 01/01/13 0405  NA 145 141  K 4.2 4.1  CL 110 106  CO2 24 25  GLUCOSE 99 96  BUN 16 14  CREATININE 1.29 1.13  CALCIUM 9.9 9.1  MG 2.2  --    Estimated Creatinine Clearance: 70.1 ml/min (by C-G formula based on Cr of 1.13).   LIVER  Recent Labs Lab 12/31/12 2225  INR 1.01     INFECTIOUS No results found for this basename: LATICACIDVEN, PROCALCITON,  in the last 168 hours   ENDOCRINE CBG (last 3)  No results found for this basename: GLUCAP,  in the last 72 hours       IMAGING x48h  Dg Chest 2 View  12/31/2012   *RADIOLOGY REPORT*  Clinical Data: The atrial fibrillation.  Diaphoresis  CHEST - 2 VIEW  Comparison: None.  Findings: Right lung is clear.  3.4 cm mass like opacity overlies the left midlung. The cardiopericardial silhouette is within normal limits for size. Imaged bony structures of the thorax are intact.  IMPRESSION: 3.4 cm mass-like opacity overlying the left mid lung, concerning for neoplasm.  CT chest recommended to further evaluate.  I personally called the results of this study to Dr. Modesto Charon at approximately 1600 hours on 12/31/2012.   Original Report Authenticated By: Kennith Center, M.D.   Ct Angio Chest Pe W/cm &/or Wo Cm  12/31/2012   *RADIOLOGY REPORT*  Clinical Data: Diaphoresis with hypotension.  Question pulmonary embolus.  CT ANGIOGRAPHY CHEST  Technique:  Multidetector CT imaging of the chest using the standard protocol during bolus administration of intravenous contrast. Multiplanar reconstructed images including MIPs were obtained and reviewed to evaluate the vascular anatomy.  Contrast: OMNIPAQUE IOHEXOL 350 MG/ML SOLN  Comparison: None.  Findings: Small pulmonary emboli are seen in segmental and subsegmental pulmonary arteries to the lingula no evidence for pulmonary embolic disease in the right lung.  No thoracic aortic aneurysm.  No evidence for a dissection flap in the thoracic aorta.  No  axillary lymphadenopathy.  No mediastinal lymphadenopathy.  No bulky lymphadenopathy in either hilum although there is some lymphoid tissue noted in the left hilum.  Lung windows reveal a 2.0 cm spiculated nodule in the anteromedial left upper lobe.  There may be a tiny focus of cavitation within this nodule.  A second 3.1 x 2.6 cm pulmonary spiculated mass is identified in the posterior left upper lobe.  Emphysema is noted in the upper lobes.  There is some compressive atelectasis in the dependent lower lobes bilaterally.  Bone windows reveal no worrisome lytic or sclerotic osseous lesions.  IMPRESSION: Tiny nonocclusive pulmonary embolus and branches of the segmental and subsegmental pulmonary artery to the left upper lobe.  Two spiculated lesions in the left upper lobe measuring 2.0 and 3.1 cm in maximum dimensions, respectively.  Imaging features are highly suspicious for neoplasm although it is unclear whether they represent  synchronous lung primaries  or possible metastatic involvement.  PET CT may prove helpful to further evaluate.  Critical Value/emergent results were called by telephone at the time of interpretation on 12/31/2012 at 2113 to Dr. Modesto Charon, who verbally acknowledged these results.   Original Report Authenticated By: Kennith Center, M.D.   Ct Abdomen Pelvis W Contrast  12/31/2012   *RADIOLOGY REPORT*  Clinical Data: Left lung mass.  Evaluate for metastatic disease.  CT ABDOMEN AND PELVIS WITH CONTRAST  Technique:  Multidetector CT imaging of the abdomen and pelvis was performed following the standard protocol during bolus administration of intravenous contrast.  Contrast: OMNIPAQUE IOHEXOL 350 MG/ML SOLN  Comparison: None.  Findings: The liver and spleen are normal.  Stomach, duodenum, pancreas, and adrenal glands are unremarkable.  2.7 cm stone is identified in the fundus of the gallbladder.  Right kidney is unremarkable.  2.6 cm cyst is identified in the upper pole of the left kidney with a  second smaller 1.2 cm cyst visible.  No abdominal aortic aneurysm although atherosclerotic calcification is noted in the wall of the abdominal aorta.  The 1.7 cm gastrohepatic lymph node is mildly enlarged.  No hepatoduodenal ligament lymphadenopathy.  No free fluid in the abdomen.  Imaging through the pelvis shows no free intraperitoneal fluid. There is no pelvic sidewall lymphadenopathy.  Bladder is unremarkable.  Prostate gland is mildly enlarged.  No substantial diverticular change in the colon.  There is no colonic diverticulitis.  The terminal ileum is normal.  The appendix is normal.  Bilateral inguinal hernias contain only fat.  Multiple small lucent lesions are seen within the bony anatomic pelvis.  Advanced degenerative changes are seen at L4-5 and L5-S1.  IMPRESSION: Borderline lymphadenopathy in the gastrohepatic ligament.  Small lucent lesions in the bony anatomic pelvis.  While nonspecific, metastatic involvement is not excluded.  Cholelithiasis.  Left renal cysts.   Original Report Authenticated By: Kennith Center, M.D.      ASSESSMENT / PLAN: (modified by STAFF MD)  Pulmonary nodules - highly suspicious for primary lung ca NSCLC - LUL . One is 3.1cm and other is 2.1cm. IF NSCLC is T3 lesion.  No Ct nodes - so N0. No obvious mets. So M0 (gastric ligament small nodes noted on report). Will make it STage 2B lesion.   - Very good functional status. ECOG 0. 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 Curerently not hypoxic   Emphysema on CT  REC -  Agree with xarelto opd Rx OPD PFT and PET scan and fu with Dr Marchelle Gearing ASAP If PET SCan shows onl disease limited to LUL and PFTs shows resectability; CVTS referral   WHITEHEART,KATHRYN, NP 01/01/2013  10:19 AM Pager: [phone] or [phone]  Dr. Kalman Shan, M.D., F.C.C.P Pulmonary and Critical Care Medicine Staff Physician [facility] Pager: [phone], If no answer or between  15:00h - 7:00h: call [phone]  01/01/2013 12:00 PM

## 2013-01-01 NOTE — Progress Notes (Signed)
   Subjective:  Denies CP or dyspnea   Objective:  Filed Vitals:   12/31/12 2331 01/01/13 0341 01/01/13 0411 01/01/13 0612  BP: 159/85 126/67  102/51  Pulse: 76 66    Temp: 98.2 F (36.8 C)  98.5 F (36.9 C)   TempSrc: Oral  Oral   Resp: 14 18    Height: 5\' 11"  (1.803 m)     Weight: 199 lb 15.3 oz (90.7 kg)     SpO2: 99% 98%      Intake/Output from previous day:  Intake/Output Summary (Last 24 hours) at 01/01/13 4098 Last data filed at 01/01/13 0200  Gross per 24 hour  Intake      0 ml  Output    300 ml  Net   -300 ml    Physical Exam: Physical exam: Well-developed well-nourished in no acute distress.  Skin is warm and dry.  HEENT is normal.  Neck is supple.  Chest is clear to auscultation with normal expansion.  Cardiovascular exam is regular rate and rhythm.  Abdominal exam nontender or distended. No masses palpated. Extremities show no edema. neuro grossly intact    Lab Results: Basic Metabolic Panel:  Recent Labs  11/91/47 1652 01/01/13 0405  NA 145 141  K 4.2 4.1  CL 110 106  CO2 24 25  GLUCOSE 99 96  BUN 16 14  CREATININE 1.29 1.13  CALCIUM 9.9 9.1  MG 2.2  --    CBC:  Recent Labs  12/31/12 1652 01/01/13 0405  WBC 15.2* 11.8*  HGB 14.4 12.6*  HCT 41.6 39.2  MCV 82.5 83.2  PLT 256 234   Cardiac Enzymes:  Recent Labs  12/31/12 2252 01/01/13 0405  TROPONINI 0.60* 0.45*     Assessment/Plan:  1 syncope-patient symptoms sound to be orthostatic mediated. Check echocardiogram for LV function. If LV function normal would not plan further workup unless he has recurrent symptoms in the future.  2 atrial fibrillation-the patient was in atrial fibrillation on presentation. Await echocardiogram for LV function; TSH normal. His CHADS score is 1 for hypertension. However, pulmonary embolus noted on CT; would treat with xeralto following WU of lung mass. Change cardizem to CD. 3 hypertension-monitor blood pressure and adjust medications as  needed.  4 lung mass-Chest CT suggests lung cancer; management per primary care. 5 elevated troponin I-troponin is mildly elevated. This may be from his atrial fibrillation or recent pulmonary embolus. Continue ASA 81 mg daily. Will consider nuclear study in the future pending w/u and prognosis of cancer. 6 pulmonary embolus- continue heparin; transition to xeralto later once lung mass addressed.   Olga Millers 01/01/2013, 7:21 AM

## 2013-01-01 NOTE — Progress Notes (Signed)
Utilization review completed.  

## 2013-01-01 NOTE — Progress Notes (Signed)
[facility] Progress Note [place] TEAM 1 - Stepdown/ICU TEAM   Jakaree Pickard AVW:[id number] DOB: 1940/12/25 DOA: 12/31/2012 PCP: Pcp Not In System  Brief narrative: Randy Mcgee is a 72 y.o. male who presents after an episode of syncope. He was in his usual state of health today, unloading a tractor-trailer, and the next thing he knew, he woke up in the ambulance on the way to the hospital. There was no prodrome other than perhaps a small amount of dizziness earlier today. EMS found him to be in A.fib with RVR, SBP of 70, after a liter of NS and Cardizem 20mg  x1, he spontaneously converted upon arrival to the ER. He has no history of A.Fib in the past. CXR reveals a mass in the left lung and therefore a CT chest performed this reveals two spiculated lesions in the LUL along with PEs.    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   Code Status: full code Family Communication: with daughter Disposition Plan: transfer to tele-   Consultants: Pulm Cards  Procedures: none  Antibiotics: none  DVT prophylaxis: Heparin  infusion  HPI/Subjective: Pt alert- without complaints- have discused medical management of A-fib and f/u for pulm issues separately with pt and daughter   Objective: Blood pressure 137/80, pulse 68, temperature 98.3 F (36.8 C), temperature source Oral, resp. rate 14, height 5\' 11"  (1.803 m), weight 90.7 kg (199 lb 15.3 oz), SpO2 98.00%.  Intake/Output Summary (Last 24 hours) at 01/01/13 1721 Last data filed at 01/01/13 1204  Gross per 24 hour  Intake    148 ml  Output    300 ml  Net   -152 ml     Exam: General: No acute respiratory distress Lungs: Clear to auscultation bilaterally without wheezes or crackles Cardiovascular: Regular rate and rhythm without murmur gallop or rub normal S1 and S2 Abdomen: Nontender, nondistended, soft, bowel sounds positive, no rebound, no ascites, no appreciable mass Extremities: No significant cyanosis, clubbing, or edema bilateral lower extremities  Data Reviewed: Basic Metabolic Panel:  Recent Labs Lab 12/31/12 1652 01/01/13 0405  NA 145 141  K 4.2 4.1  CL 110 106  CO2 24 25  GLUCOSE 99 96  BUN 16 14  CREATININE 1.29 1.13  CALCIUM 9.9 9.1  MG 2.2  --    Liver Function Tests: No results found for this basename: AST, ALT, ALKPHOS, BILITOT, PROT, ALBUMIN,  in the last 168 hours No results found for this basename: LIPASE, AMYLASE,  in the last 168 hours No results found for this basename:  AMMONIA,  in the last 168 hours CBC:  Recent Labs Lab 12/31/12 1652 01/01/13 0405  WBC 15.2* 11.8*  HGB 14.4 12.6*  HCT 41.6 39.2  MCV 82.5 83.2  PLT 256 234   Cardiac Enzymes:  Recent Labs Lab 12/31/12 2252 01/01/13 0405 01/01/13 1030  TROPONINI 0.60* 0.45* <0.30   BNP (last 3 results) No results found for this basename: PROBNP,  in the last 8760 hours CBG: No results found for this basename: GLUCAP,  in the last 168 hours  No results found for this or any previous visit (from the past 240 hour(s)).   Studies:  Recent x-ray  studies have been reviewed in detail by the Attending Physician  Scheduled Meds:  Scheduled Meds: . aspirin EC  81 mg Oral Daily  . diltiazem  120 mg Oral Daily  . lisinopril  10 mg Oral Daily  . multivitamin with minerals  1 tablet Oral Daily  . patient's guide to using coumadin book   Does not apply Once  . [START ON 01/02/2013] pneumococcal 23 valent vaccine  0.5 mL Intramuscular Tomorrow-1000  . sodium chloride  3 mL Intravenous Q12H  . warfarin  7.5 mg Oral ONCE-1800  . warfarin   Does not apply Once  . Warfarin - Pharmacist Dosing Inpatient   Does not apply q1800   Continuous Infusions: . heparin 1,600 Units/hr (01/01/13 1441)    Time spent on care of this patient: 35 min   Calven Gilkes, MD  [facility] Office  [phone] Pager - Text Page per Loretha Stapler as per below:  On-Call/Text Page:      [email or web address]      password [email or web address]  If 7PM-7AM, please contact night-coverage [email or web address] Password [email or web address] 01/01/2013, 5:21 PM   LOS: 1 day

## 2013-01-01 NOTE — Progress Notes (Addendum)
ANTICOAGULATION CONSULT NOTE   Pharmacy Consult for Heparin / Coumadin Indication: pulmonary embolus  No Known Allergies  Labs:  Recent Labs  12/31/12 1652 12/31/12 2225 12/31/12 2252 01/01/13 0405 01/01/13 0752  HGB 14.4  --   --  12.6*  --   HCT 41.6  --   --  39.2  --   PLT 256  --   --  234  --   LABPROT  --  13.1  --   --   --   INR  --  1.01  --   --   --   HEPARINUNFRC  --   --   --   --  0.26*  CREATININE 1.29  --   --  1.13  --   TROPONINI  --   --  0.60* 0.45*  --     Estimated Creatinine Clearance: 70.1 ml/min (by C-G formula based on Cr of 1.13).   Assessment: 25 yom presented to the ED with syncope. CT demonstrated a PE. Also lung masses. To start IV heparin for anticoagulation. Baseline CBC is WNL.   Planning to begin oral ant-coagulation once lung mass work up is complete HL this AM = 0.26  Goal of Therapy:  Heparin level 0.3-0.7 units/ml Monitor platelets by anticoagulation protocol: Yes INR = 2 to 3   Plan:  1) Increase heparin to 1600 units / hr 2) Heparin level 6 hours after heparin increased 3) Follow up oral anti-coagulation 4) Daily labs  Thank you. Okey Regal, PharmD [phone]  Elwin Sleight 01/01/2013,10:00 AM  Starting Coumadin today Plan -- 7.5 mg po x 1 dose, INR daily  Thank  You.  Okey Regal, PharmD [phone]

## 2013-01-01 NOTE — Care Management Note (Signed)
    Page 1 of 2   01/02/2013     10:23:47 AM   CARE MANAGEMENT NOTE 01/02/2013  Patient:  Randy Mcgee, Randy Mcgee   Account Number:  [id number]  Date Initiated:  01/01/2013  Documentation initiated by:  Donn Pierini  Subjective/Objective Assessment:   Pt admitted with syncope, afib, positive troponins, + PE     Action/Plan:   PTA pt lived at home with family, NCM to follow for d/c needs.   Anticipated DC Date:  01/02/2013   Anticipated DC Plan:  HOME/SELF CARE      DC Planning Services  CM consult  Medication Assistance      Choice offered to / List presented to:             Status of service:  Completed, signed off Medicare Important Message given?   (If response is "NO", the following Medicare IM given date fields will be blank) Date Medicare IM given:   Date Additional Medicare IM given:    Discharge Disposition:  HOME/SELF CARE  Per UR Regulation:  Reviewed for med. necessity/level of care/duration of stay  If discussed at Long Length of Stay Meetings, dates discussed:    Comments:  01/02/13- 1020- Donn Pierini RN, BSN [phone] Per MD pt to go home today on Xarelto- 30 free card given to pt- will need script for 30 day (no refills) to use with card- [facility] [place]- [facility] TC[facility]- spoke with pt regarding need to apply for Medicare part B and D as soon as possible to that he will have coverage for medications- he also will need to call and get f/u appointment with his PCP -Dr. Graciela Husbands- pt to do this when he gets home.  01/01/13- 1400- Donn Pierini RN, BSN [phone] Referral received for Xarelto benefit check- spoke with pt at bedside regarding insurance coverage- (pt showing Medicare part A only) pt confirmed that he only has Medicare part A- he was working up until last month and had company insurance until he was laid off. He states that he has not had a chance to go and apply for the Medicare part B and D coverage- he currently does not  have medication coverage- pt states that he plans to go apply as soon as he is discharged- pt would be eligible for the 30 day free card with Xarelto- it would be unknown what  cost would be after that depending on what part D coverage he signs up for. Explained this to pt and he voices understanding. Since pt is planning on getting medicare coverage he would not qualify for the assistance program. [facility] [place] -if placed on Xarelto will call to see if they have drug in stock. called and spoke with MD regarding insurance issues at this time-

## 2013-01-01 NOTE — Progress Notes (Signed)
CRITICAL VALUE ALERT  Critical value received:  Troponin 0.61  Date of notification:  01/01/13  Time of notification:  0015  Critical value read back:yes  Nurse who received alert:  Leta Baptist RN  MD notified (1st page):  NP Craige Cotta  Time of first page:  0016  MD notified (2nd page):  Time of second page:  Responding MD:  NP Craige Cotta  Time MD responded:  0017  No new orders received, pt denies chest pain or SOB, will continue to monitor.

## 2013-01-01 NOTE — Progress Notes (Signed)
ANTICOAGULATION CONSULT NOTE - Follow Up Consult  Pharmacy Consult for Heparin Indication: pulmonary embolus  No Known Allergies  Patient Measurements: Height: 5\' 11"  (180.3 cm) Weight: 199 lb 15.3 oz (90.7 kg) IBW/kg (Calculated) : 75.3 Heparin Dosing Weight: 90.7kg  Vital Signs: Temp: 98.3 F (36.8 C) (09/04 1556) Temp src: Oral (09/04 1556) BP: 137/80 mmHg (09/04 0754) Pulse Rate: 68 (09/04 0754)  Labs:  Recent Labs  12/31/12 1652 12/31/12 2225 12/31/12 2252 01/01/13 0405 01/01/13 0752 01/01/13 1030 01/01/13 1630  HGB 14.4  --   --  12.6*  --   --   --   HCT 41.6  --   --  39.2  --   --   --   PLT 256  --   --  234  --   --   --   LABPROT  --  13.1  --   --   --   --   --   INR  --  1.01  --   --   --   --   --   HEPARINUNFRC  --   --   --   --  0.26*  --  0.19*  CREATININE 1.29  --   --  1.13  --   --   --   TROPONINI  --   --  0.60* 0.45*  --  <0.30  --     Estimated Creatinine Clearance: 70.1 ml/min (by C-G formula based on Cr of 1.13).   Medications:  Heparin @ 1600 units/hr  Assessment: 72yom continues on heparin for PE (9/3). Repeat heparin level this evening is actually lower despite rate increase this morning. No issues with infusion per RN.  Goal of Therapy:  Heparin level 0.3-0.7 units/ml Monitor platelets by anticoagulation protocol: Yes   Plan:  1) Heparin re-bolus 2500 units x 1 2) Increase heparin to 1900 units/hr 3) Check 6 hour heparin level  Fredrik Rigger 01/01/2013,5:35 PM

## 2013-01-02 ENCOUNTER — Telehealth: Payer: Self-pay | Admitting: Internal Medicine

## 2013-01-02 DIAGNOSIS — R911 Solitary pulmonary nodule: Secondary | ICD-10-CM

## 2013-01-02 DIAGNOSIS — J439 Emphysema, unspecified: Secondary | ICD-10-CM

## 2013-01-02 LAB — CBC
HCT: 40.3 % (ref 39.0–52.0)
Hemoglobin: 13.4 g/dL (ref 13.0–17.0)
MCHC: 33.3 g/dL (ref 30.0–36.0)
RBC: 4.86 MIL/uL (ref 4.22–5.81)

## 2013-01-02 LAB — PROTIME-INR: Prothrombin Time: 13.1 seconds (ref 11.6–15.2)

## 2013-01-02 LAB — HEPARIN LEVEL (UNFRACTIONATED): Heparin Unfractionated: 0.53 IU/mL (ref 0.30–0.70)

## 2013-01-02 MED ORDER — DILTIAZEM HCL ER COATED BEADS 120 MG PO CP24: 120.0000 mg | ORAL_CAPSULE | Freq: Every day | ORAL | Status: DC

## 2013-01-02 MED ORDER — ADULT MULTIVITAMIN W/MINERALS CH: 1.0000 | ORAL_TABLET | Freq: Every day | ORAL | Status: AC

## 2013-01-02 MED ORDER — RIVAROXABAN 20 MG PO TABS: 20.0000 mg | ORAL_TABLET | Freq: Every day | ORAL | Status: AC

## 2013-01-02 MED ORDER — ASPIRIN 81 MG PO TBEC: 81.0000 mg | DELAYED_RELEASE_TABLET | Freq: Every day | ORAL | Status: DC

## 2013-01-02 MED ORDER — RIVAROXABAN 15 MG PO TABS: 15.0000 mg | ORAL_TABLET | Freq: Two times a day (BID) | ORAL | Status: DC

## 2013-01-02 MED ORDER — WARFARIN SODIUM 7.5 MG PO TABS
7.5000 mg | ORAL_TABLET | Freq: Once | ORAL | Status: DC
  Filled 2013-01-02: qty 1

## 2013-01-02 NOTE — Progress Notes (Signed)
ANTICOAGULATION CONSULT NOTE - Follow Up Consult  Pharmacy Consult for Heparin Indication: pulmonary embolus  No Known Allergies  Patient Measurements: Height: 5\' 11"  (180.3 cm) Weight: 199 lb 15.3 oz (90.7 kg) IBW/kg (Calculated) : 75.3 Heparin Dosing Weight: 90.7kg  Vital Signs: Temp: 98.2 F (36.8 C) (09/04 2300) Temp src: Oral (09/04 2300) BP: 122/68 mmHg (09/04 2300) Pulse Rate: 62 (09/04 2300)  Labs:  Recent Labs  12/31/12 1652 12/31/12 2225 12/31/12 2252 01/01/13 0405 01/01/13 0752 01/01/13 1030 01/01/13 1630 01/02/13 0020  HGB 14.4  --   --  12.6*  --   --   --   --   HCT 41.6  --   --  39.2  --   --   --   --   PLT 256  --   --  234  --   --   --   --   LABPROT  --  13.1  --   --   --   --   --   --   INR  --  1.01  --   --   --   --   --   --   HEPARINUNFRC  --   --   --   --  0.26*  --  0.19* 0.53  CREATININE 1.29  --   --  1.13  --   --   --   --   TROPONINI  --   --  0.60* 0.45*  --  <0.30  --   --     Estimated Creatinine Clearance: 70.1 ml/min (by C-G formula based on Cr of 1.13).   Medications:  Heparin @ 1900 units/hr  Assessment: 72yom continues on heparin for PE (9/3). Heparin level therapeutic.No bleeding noted.  Goal of Therapy:  Heparin level 0.3-0.7 units/ml Monitor platelets by anticoagulation protocol: Yes   Plan:  1) Continue heparin at 1900 units/hr 2) F/u daily heparin level  Christoper Fabian, PharmD, BCPS Clinical pharmacist, pager [phone] 01/02/2013,2:43 AM

## 2013-01-02 NOTE — Progress Notes (Signed)
ANTICOAGULATION CONSULT NOTE - Follow Up Consult  Pharmacy Consult for Heparin + coumadin Indication: pulmonary embolus  No Known Allergies  Patient Measurements: Height: 5\' 11"  (180.3 cm) Weight: 199 lb 15.3 oz (90.7 kg) IBW/kg (Calculated) : 75.3 Heparin Dosing Weight: 90.7kg  Vital Signs: Temp: 98.2 F (36.8 C) (09/05 0700) Temp src: Oral (09/05 0700) BP: 147/77 mmHg (09/05 0355) Pulse Rate: 69 (09/05 0355)  Labs:  Recent Labs  12/31/12 1652 12/31/12 2225 12/31/12 2252 01/01/13 0405  01/01/13 1030 01/01/13 1630 01/02/13 0020 01/02/13 0526  HGB 14.4  --   --  12.6*  --   --   --   --  13.4  HCT 41.6  --   --  39.2  --   --   --   --  40.3  PLT 256  --   --  234  --   --   --   --  224  LABPROT  --  13.1  --   --   --   --   --   --  13.1  INR  --  1.01  --   --   --   --   --   --  1.01  HEPARINUNFRC  --   --   --   --   < >  --  0.19* 0.53 0.62  CREATININE 1.29  --   --  1.13  --   --   --   --   --   TROPONINI  --   --  0.60* 0.45*  --  <0.30  --   --   --   < > = values in this interval not displayed.  Estimated Creatinine Clearance: 70.1 ml/min (by C-G formula based on Cr of 1.13).  Medications:  Heparin @ 1900 units/hr  Assessment: 72yom continues on heparin for PE (9/3). Heparin level therapeutic.No bleeding noted. INR remains subtherapeutic as expected at 1.01.   Goal of Therapy:  Heparin level 0.3-0.7 units/ml Monitor platelets by anticoagulation protocol: Yes   Plan:  1. Continue heparin gtt at 1900 units/hr 2. Repeat coumadin 7.5mg  PO x 1 tonight 3. F/u AM INR, heparin level and CBC  Lysle Pearl, PharmD, BCPS Pager # [phone] 01/02/2013 8:51 AM

## 2013-01-02 NOTE — Telephone Encounter (Signed)
Patient being discharged 01/02/2013 from inpatient . HE needs OPD PFT (any location) and PET Scan before he sees me. Please schedule these ASAP from now and have him see me. Currently I have him double booked to see me 01/16/13 at 13.30  Triage send message to [facility] so she can act on this after patient dc or you/Libby act on this depending on workflow    Dr. Kalman Shan, M.D., [facility].C.P Pulmonary and Critical Care Medicine Staff Physician [facility] [place] [facility] Pager: [phone], If no answer or between  15:00h - 7:00h: call [phone]  01/02/2013 10:17 AM

## 2013-01-02 NOTE — Telephone Encounter (Signed)
Pet scheduled 01/08/13@8 :45am arrival npo after midnight Randy Mcgee

## 2013-01-02 NOTE — Telephone Encounter (Signed)
LMOMTCB x1 for pt 

## 2013-01-02 NOTE — Discharge Summary (Signed)
Physician Discharge Summary  Randy Mcgee DOB: October 24, 1940 DOA: 12/31/2012  PCP: Pcp Not In System  Admit date: 12/31/2012 Discharge date: 01/02/2013  Time spent: 30 minutes  Recommendations for Outpatient Follow-up:  1. Follow up with PCP-preferably Monday 9/8 for routine post hospital appointment 2. Follow up with Cardiologist as scheduled (see AVS for appointment time) 3. Follow up with Dr. Dalbert Mayotte as scheduled (see AVS)  History of present illness:  72 y.o. male who presented after an episode of syncope. He was in his usual state of health until date of admission when while unloading a tractor-trailer he had a syncopal event.  He had no recollection of that event and the next thing he knew, he woke up in the ambulance on the way to the hospital. There was no prodrome other than perhaps a small amount of dizziness earlier. EMS found him to be in A.fib with RVR, SBP of 70. After a liter of NS and Cardizem 20mg  x1, he spontaneously converted to NSR upon arrival to the ER. He has no history of A.Fib in the past.   A CXR revealed a mass in the left lung and therefore a CT chest was performed which revealed two spiculated lesions in the LUL along with PEs.   Discharge Diagnoses/Hospital Course:     Syncope and collapse -suspected due to rapid atrial fibrillation - has not recurred since admission    Atrial fibrillation with RVR - converted to NSR  - on PO Cardizem - new start  - CHADS 1 so no indication to anti coagulate for atrial fibrillation (see below) - Cardiology evaluated this admission and suggests will need stress Myoview if surgery indicated for his pulmonary masses -ECHO without any RWMA -suspect brought about by acute PE     PE (pulmonary embolism) -suspect due to underlying malignancy -started on Xarelto this admission - pt to receive one month free - he is in process of applying for Medicare Part B and at time of discharge actual copay unknown - if he is  unable to afford Xarelto he will need to be transitioned to Coumadin as an outpt  -ECHO this admission did not show RV strain    Elevated troponin -due to stress of AF and PE    Emphysema -compensated    Pulmonary nodules LUL -evaluated by Pulmonary Medicine this admission -OP follow up has been scheduled including PET scan   HTN -controlled -continue previous ACE I - CCB blocker added this admission as rate control agent   Discharge Condition: stable  Diet recommendation: Heart Healthy  Filed Weights   12/31/12 2331  Weight: 90.7 kg (199 lb 15.3 oz)   Procedures: ECHO: - Left ventricle: The cavity size was normal. Systolic function was normal. The estimated ejection fraction was in the range of 60% to 65%. Wall motion was normal; there were no regional wall motion abnormalities. Left ventricular diastolic function parameters were normal. - Atrial septum: No defect or patent foramen ovale was identified.   Consultations:  Cardiology  Pulmonology  Discharge Exam: Filed Vitals:   01/02/13 1048  BP: 149/91  Pulse:   Temp:   Resp:    General: No acute respiratory distress  Lungs: Clear to auscultation bilaterally without wheezes or crackles  Cardiovascular: Regular rate and rhythm without murmur gallop or rub normal S1 and S2  Abdomen: Nontender, nondistended, soft, bowel sounds positive, no rebound, no ascites, no appreciable mass  Extremities: No significant cyanosis, clubbing, or edema bilateral lower extremities  Discharge Instructions  Future Appointments Provider Department Dept Phone   01/07/2013 11:30 AM [facility] [place] [facility] [phone]   01/08/2013 9:00 AM [facility] [phone]   Pt should arrive15 minutes prior to scheduled appt time. Please inform patient that exam will take a minimum of 1 1/2 hours. Patient to be NPO 6 hours prior to exam  and  should not take any insulin the day of exam.   01/16/2013 1:30 PM Kalman Shan, MD [facility] [phone]       Medication List         aspirin 81 MG EC tablet  Take 1 tablet (81 mg total) by mouth daily.     diltiazem 120 MG 24 hr capsule  Commonly known as:  CARDIZEM CD  Take 1 capsule (120 mg total) by mouth daily.     lisinopril 10 MG tablet  Commonly known as:  PRINIVIL,ZESTRIL  Take 10 mg by mouth daily.     multivitamin with minerals tablet  Take 2 tablets by mouth daily.     multivitamin with minerals Tabs tablet  Take 1 tablet by mouth daily.     Rivaroxaban 15 MG Tabs tablet  Commonly known as:  XARELTO  Take 1 tablet (15 mg total) by mouth 2 (two) times daily with a meal.     Rivaroxaban 20 MG Tabs tablet  Commonly known as:  XARELTO  Take 1 tablet (20 mg total) by mouth daily.  Start taking on:  01/23/2013       No Known Allergies Follow-up Information   Follow up with Pcp Not In System. Call on 01/05/2013. ( Dr. Graciela Husbands in [facility])       Follow up with [facility], MD On 01/16/2013. (Arrive at 1 PM for 130 PM appointment)    Specialty:  Pulmonary Disease   Contact information:   [address] [place] [address] [phone]       Schedule an appointment as soon as possible for a visit with Olga Millers, MD. (to be seen in 2-4 weeks )    Specialty:  Cardiology   Contact information:   [address] [place] [address] [phone]      Labs: Basic Metabolic Panel:  Recent Labs Lab 12/31/12 1652 01/01/13 0405  NA 145 141  K 4.2 4.1  CL 110 106  CO2 24 25  GLUCOSE 99 96  BUN 16 14  CREATININE 1.29 1.13  CALCIUM 9.9 9.1  MG 2.2  --    CBC:  Recent Labs Lab 12/31/12 1652 01/01/13 0405 01/02/13 0526  WBC 15.2* 11.8* 9.6  HGB 14.4 12.6* 13.4  HCT 41.6 39.2 40.3  MCV 82.5 83.2 82.9  PLT 256 234 224   Cardiac Enzymes:  Recent Labs Lab 12/31/12 2252 01/01/13 0405 01/01/13 1030   TROPONINI 0.60* 0.45* <0.30   Signed:  ELLIS,ALLISON L. ANP [facility] 01/02/2013, 12:21 PM   I have personally examined this patient and reviewed the entire database. I have reviewed the above note, made any necessary editorial changes, and agree with its content.  Lonia Blood, MD [facility]

## 2013-01-02 NOTE — Telephone Encounter (Signed)
Orders placed. Pt is not d/c yet. I called Marcelino Duster and scheduled for 01/07/13 at 11:30 over at [facility]. Please advise PCC's regarding PET scan thanks

## 2013-01-02 NOTE — Progress Notes (Signed)
   Subjective:  Denies CP or dyspnea   Objective:  Filed Vitals:   01/01/13 2300 01/02/13 0300 01/02/13 0355 01/02/13 0700  BP: 122/68  147/77   Pulse: 62 60 69   Temp: 98.2 F (36.8 C)  98 F (36.7 C) 98.2 F (36.8 C)  TempSrc: Oral   Oral  Resp: 15  18   Height:      Weight:      SpO2: 97%  96%     Intake/Output from previous day:  Intake/Output Summary (Last 24 hours) at 01/02/13 1007 Last data filed at 01/02/13 0700  Gross per 24 hour  Intake    928 ml  Output   1550 ml  Net   -622 ml    Physical Exam: Physical exam: Well-developed well-nourished in no acute distress.  Skin is warm and dry.  HEENT is normal.  Neck is supple.  Chest is clear to auscultation with normal expansion.  Cardiovascular exam is regular rate and rhythm.  Abdominal exam nontender or distended. No masses palpated. Extremities show no edema. neuro grossly intact    Lab Results: Basic Metabolic Panel:  Recent Labs  16/10/96 1652 01/01/13 0405  NA 145 141  K 4.2 4.1  CL 110 106  CO2 24 25  GLUCOSE 99 96  BUN 16 14  CREATININE 1.29 1.13  CALCIUM 9.9 9.1  MG 2.2  --    CBC:  Recent Labs  01/01/13 0405 01/02/13 0526  WBC 11.8* 9.6  HGB 12.6* 13.4  HCT 39.2 40.3  MCV 83.2 82.9  PLT 234 224   Cardiac Enzymes:  Recent Labs  12/31/12 2252 01/01/13 0405 01/01/13 1030  TROPONINI 0.60* 0.45* <0.30     Assessment/Plan:  1 syncope-patient symptoms sound to be orthostatic mediated. Echocardiogram shows normal LV function. No plans for further evaluation unless recurrent episodes in the future. 2 atrial fibrillation-the patient was in atrial fibrillation on presentation. LV function normal; TSH normal. His CHADS score is 1 for hypertension. However, pulmonary embolus noted on CT; would treat with xeralto following WU of lung mass. Continue cardizem. 3 hypertension-monitor blood pressure and adjust medications as needed.  4 lung mass-Chest CT suggests lung cancer;  Pulmonary has seen. If it turns out to be resectable, would need nuclear study preop. 5 elevated troponin I-troponin is mildly elevated. This may be from his atrial fibrillation or recent pulmonary embolus. Continue ASA 81 mg daily. Will consider nuclear study in the future pending w/u and prognosis of cancer. 6 pulmonary embolus- continue heparin; transition to xeralto later once lung mass addressed.  Patient can fu with me for cardiac issues in 4 weeks. Olga Millers 01/02/2013, 10:07 AM

## 2013-01-02 NOTE — Progress Notes (Signed)
Patient has been discharged home today. IV has been D/C. Have reviewed all discharge instructions with the patient and his son and have answered all questions. Junious Silk NP also came down and talked with the patient and son about his stay she also printed out a discharge summary so patient could take to his PCP.

## 2013-01-03 NOTE — ED Provider Notes (Signed)
I saw and evaluated the patient, reviewed the resident's note and I agree with the findings and plan and agree with their ECG interpretation. Patient with a syncopal episode. Was found to be in A. fib RVR with hypertension. EMS gave 20 mg of diltiazem. His heart rate improved. His chest x-ray found a lung mass. He had conversion back to sinus rhythm with the initial dose of Cardizem. CT showed malignancy and pulmonary embolism.Marland Kitchen Has elevated troponins. Seen by cardiology. Will be admitted to internal medicine  Randy Mcgee. [name], MD 01/03/13 0730

## 2013-01-05 NOTE — Telephone Encounter (Signed)
I spoke with pt and is aware of appts date and time.nothing further needed

## 2013-01-07 ENCOUNTER — Ambulatory Visit (HOSPITAL_COMMUNITY)
Admit: 2013-01-07 | Discharge: 2013-01-07 | Disposition: A | Discharge status: Home / Self Care | Payer: Medicare Other | Attending: Internal Medicine | Admitting: Internal Medicine

## 2013-01-07 DIAGNOSIS — Z87891 Personal history of nicotine dependence: Secondary | ICD-10-CM | POA: Insufficient documentation

## 2013-01-07 DIAGNOSIS — J438 Other emphysema: Secondary | ICD-10-CM | POA: Insufficient documentation

## 2013-01-07 DIAGNOSIS — J439 Emphysema, unspecified: Secondary | ICD-10-CM

## 2013-01-07 LAB — PULMONARY FUNCTION TEST

## 2013-01-07 MED ORDER — ALBUTEROL SULFATE (5 MG/ML) 0.5% IN NEBU
2.5000 mg | INHALATION_SOLUTION | Freq: Once | RESPIRATORY_TRACT | Status: AC
  Administered 2013-01-07: 2.5 mg via RESPIRATORY_TRACT

## 2013-01-08 ENCOUNTER — Encounter (HOSPITAL_COMMUNITY): Payer: Medicare Other

## 2013-01-08 ENCOUNTER — Encounter (HOSPITAL_COMMUNITY)
Admission: RE | Admit: 2013-01-08 | Discharge: 2013-01-08 | Disposition: A | Discharge status: Home / Self Care | Payer: Medicare Other | Source: Ambulatory Visit | Attending: Internal Medicine | Admitting: Internal Medicine

## 2013-01-08 ENCOUNTER — Encounter (HOSPITAL_COMMUNITY): Payer: Self-pay

## 2013-01-08 DIAGNOSIS — M948X9 Other specified disorders of cartilage, unspecified sites: Secondary | ICD-10-CM | POA: Insufficient documentation

## 2013-01-08 DIAGNOSIS — K8020 Calculus of gallbladder without cholecystitis without obstruction: Secondary | ICD-10-CM | POA: Insufficient documentation

## 2013-01-08 DIAGNOSIS — R911 Solitary pulmonary nodule: Secondary | ICD-10-CM

## 2013-01-08 DIAGNOSIS — J984 Other disorders of lung: Secondary | ICD-10-CM | POA: Insufficient documentation

## 2013-01-08 DIAGNOSIS — D1739 Benign lipomatous neoplasm of skin and subcutaneous tissue of other sites: Secondary | ICD-10-CM | POA: Insufficient documentation

## 2013-01-08 DIAGNOSIS — N289 Disorder of kidney and ureter, unspecified: Secondary | ICD-10-CM | POA: Insufficient documentation

## 2013-01-08 DIAGNOSIS — C3490 Malignant neoplasm of unspecified part of unspecified bronchus or lung: Secondary | ICD-10-CM | POA: Insufficient documentation

## 2013-01-08 MED ORDER — FLUDEOXYGLUCOSE F - 18 (FDG) INJECTION
16.5000 | Freq: Once | INTRAVENOUS | Status: AC | PRN
  Administered 2013-01-08: 16.5 via INTRAVENOUS

## 2013-01-09 LAB — CBC
HGB: 11.7 g/dL — ABNORMAL LOW (ref 13.0–18.0)
MCHC: 34 g/dL (ref 32.0–36.0)
RBC: 4.23 10*6/uL — ABNORMAL LOW (ref 4.40–5.90)

## 2013-01-09 LAB — COMPREHENSIVE METABOLIC PANEL
BUN: 16 mg/dL (ref 7–18)
Chloride: 108 mmol/L — ABNORMAL HIGH (ref 98–107)
Creatinine: 1.21 mg/dL (ref 0.60–1.30)
EGFR (Non-African Amer.): 60 — ABNORMAL LOW
SGOT(AST): 31 U/L (ref 15–37)
SGPT (ALT): 40 U/L (ref 12–78)
Total Protein: 6.9 g/dL (ref 6.4–8.2)

## 2013-01-09 LAB — URINALYSIS, COMPLETE
Nitrite: NEGATIVE
Ph: 6 (ref 4.5–8.0)
Squamous Epithelial: NONE SEEN
WBC UR: <1 /HPF (ref 0–5)

## 2013-01-09 LAB — TROPONIN I: Troponin-I: <0.02 ng/mL

## 2013-01-09 LAB — PROTIME-INR: INR: 2.1

## 2013-01-10 ENCOUNTER — Observation Stay: Payer: Self-pay | Admitting: Student

## 2013-01-10 LAB — CBC
HGB: 11.8 g/dL — ABNORMAL LOW (ref 13.0–18.0)
MCHC: 32.7 g/dL (ref 32.0–36.0)
MCV: 82 fL (ref 80–100)
RBC: 4.36 10*6/uL — ABNORMAL LOW (ref 4.40–5.90)
RDW: 15 % — ABNORMAL HIGH (ref 11.5–14.5)
WBC: 12 10*3/uL — ABNORMAL HIGH (ref 3.8–10.6)

## 2013-01-10 LAB — HEMOGLOBIN: HGB: 12.1 g/dL — ABNORMAL LOW (ref 13.0–18.0)

## 2013-01-16 ENCOUNTER — Ambulatory Visit (INDEPENDENT_AMBULATORY_CARE_PROVIDER_SITE_OTHER): Payer: Medicare Other | Admitting: Internal Medicine

## 2013-01-16 ENCOUNTER — Encounter: Payer: Self-pay | Admitting: Internal Medicine

## 2013-01-16 VITALS — BP 142/90 | HR 102 | Temp 98.4°F | Ht 71.0 in | Wt 200.6 lb

## 2013-01-16 DIAGNOSIS — R911 Solitary pulmonary nodule: Secondary | ICD-10-CM

## 2013-01-16 NOTE — Progress Notes (Signed)
Subjective:    Patient ID: Randy Mcgee, male    DOB: [birth year], 72 y.o.   MRN: [id number]  PCP Renie Ora at [facility]  HPI:   INPatienbt consult 12/31/12 72 y/o M admitted 9/3 after an episode of syncope. States he was in his usually state of health until 9/3 when he was unloading a tractor trailer. He felt lightheaded several times while bending over, but this resolved when he stood up straight. He bent over a 4th time and woke up in the ambulance. He denies any chest pain or SOB prior to this episode. Also denies fevers, chills, cough, hemoptysis, leg/calf pain. He has known afib and found to have tiny PE's on admit & pulmonary nodules concerning for neoplasm.    12/31/2012 *RADIOLOGY REPORT* Clinical Data: Diaphoresis with hypotension. Question pulmonary embolus. CT ANGIOGRAPHY CHEST Technique: Multidetector CT imaging of the chest using the standard protocol during bolus administration of intravenous contrast. Multiplanar reconstructed images including MIPs were obtained and reviewed to evaluate the vascular anatomy. Contrast: OMNIPAQUE IOHEXOL 350 MG/ML SOLN Comparison: None. Findings: Small pulmonary emboli are seen in segmental and subsegmental pulmonary arteries to the lingula no evidence for pulmonary embolic disease in the right lung. No thoracic aortic aneurysm. No evidence for a dissection flap in the thoracic aorta. No axillary lymphadenopathy. No mediastinal lymphadenopathy. No bulky lymphadenopathy in either hilum although there is some lymphoid tissue noted in the left hilum. Lung windows reveal a 2.0 cm spiculated nodule in the anteromedial left upper lobe. There may be a tiny focus of cavitation within this nodule. A second 3.1 x 2.6 cm pulmonary spiculated mass is identified in the posterior left upper lobe. Emphysema is noted in the upper lobes. There is some compressive atelectasis in the dependent lower lobes bilaterally. Bone windows reveal no worrisome lytic or  sclerotic osseous lesions. IMPRESSION: Tiny nonocclusive pulmonary embolus and branches of the segmental and subsegmental pulmonary artery to the left upper lobe. Two spiculated lesions in the left upper lobe measuring 2.0 and 3.1 cm in maximum dimensions, respectively. Imaging features are highly suspicious for neoplasm although it is unclear whether they represent synchronous lung primaries or possible metastatic involvement. PET CT may prove helpful to further evaluate. Critical Value/emergent results were called by telephone at the time of interpretation on 12/31/2012 at 2113 to Dr. Modesto Charon, who verbally acknowledged these results. Original Report Authenticated By: Kennith Center, M.D.    Ct Abdomen Pelvis W Contrast  12/31/2012 *RADIOLOGY REPORT* Clinical Data: Left lung mass. Evaluate for metastatic disease. CT ABDOMEN AND PELVIS WITH CONTRAST Technique: Multidetector CT imaging of the abdomen and pelvis was performed following the standard protocol during bolus administration of intravenous contrast. Contrast: OMNIPAQUE IOHEXOL 350 MG/ML SOLN Comparison: None. Findings: The liver and spleen are normal. Stomach, duodenum, pancreas, and adrenal glands are unremarkable. 2.7 cm stone is identified in the fundus of the gallbladder. Right kidney is unremarkable. 2.6 cm cyst is identified in the upper pole of the left kidney with a second smaller 1.2 cm cyst visible. No abdominal aortic aneurysm although atherosclerotic calcification is noted in the wall of the abdominal aorta. The 1.7 cm gastrohepatic lymph node is mildly enlarged. No hepatoduodenal ligament lymphadenopathy. No free fluid in the abdomen. Imaging through the pelvis shows no free intraperitoneal fluid. There is no pelvic sidewall lymphadenopathy. Bladder is unremarkable. Prostate gland is mildly enlarged. No substantial diverticular change in the colon. There is no colonic diverticulitis. The terminal ileum is normal. The appendix is normal.  Bilateral inguinal hernias contain only fat. Multiple small lucent lesions are seen within the bony anatomic pelvis. Advanced degenerative changes are seen at L4-5 and L5-S1. IMPRESSION: Borderline lymphadenopathy in the gastrohepatic ligament. Small lucent lesions in the bony anatomic pelvis. While nonspecific, metastatic involvement is not excluded. Cholelithiasis. Left renal cysts. Original Report Authenticated By: Kennith Center, M.D.      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 One is 3.1cm and other is 2.1cm. IF NSCLC is T3 lesion. No Ct nodes - so N0. No obvious mets. So M0 (gastric ligament small nodes noted on report). Will make it STage 2B lesion.  - Very good functional status. ECOG 0. 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 Curerently not hypoxic . REcommened changed to xarelto  Emphysema on CT  - will need PFT  REC -  Agree with xarelto opd Rx  PFT PET scan  OV 01/16/2013   FU to review results. Doing well. Only issue on 01/10/13 had mild GI bleed and scoped at [facility] and taking xarelto at 15mg  once a day and stopped asa.   PT shows normal lung volume and spiroemtyr but DLCO 22.3/66%, FEv 4.2L/10%  Had PET SCan 01/08/13 . Imaging was reviewed by me. Key findings are :  - 2 LUL synchronous high pre=test prpob nodule  - left axillar uptake - likely reactive  - left iliac regin uptake- indeterminate, biiposy recommended  He lives in [place] and wants Rx htere    Review of Systems  Constitutional: Negative for fever and unexpected weight change.  HENT: Negative for ear pain, nosebleeds, congestion, sore throat, rhinorrhea, sneezing, trouble swallowing, dental problem, postnasal drip and sinus pressure.   Eyes: Negative for redness and itching.  Respiratory: Negative for cough, chest tightness, shortness of breath and wheezing.   Cardiovascular: Negative for palpitations and leg swelling.   Gastrointestinal: Negative for nausea and vomiting.  Genitourinary: Negative for dysuria.  Musculoskeletal: Negative for joint swelling.  Skin: Negative for rash.  Neurological: Negative for headaches.  Hematological: Does not bruise/bleed easily.  Psychiatric/Behavioral: Negative for dysphoric mood. The patient is not nervous/anxious.        Objective:   Physical Exam  Discussion only visit      Assessment & Plan:

## 2013-01-16 NOTE — Patient Instructions (Addendum)
#  Left upper lobe nodule x 2  - liely lung cancer  - you wil likely need biopsy of hip area - and if this does not show cancer you likely need your left upper lobe removed - my CMA will send note to your primary care doctor - refer you to pulmonary clinic and thoracic surgery clinic at [facility] asap

## 2013-01-18 ENCOUNTER — Telehealth: Payer: Self-pay | Admitting: Internal Medicine

## 2013-01-18 ENCOUNTER — Encounter: Payer: Self-pay | Admitting: Internal Medicine

## 2013-01-18 DIAGNOSIS — I2699 Other pulmonary embolism without acute cor pulmonale: Secondary | ICD-10-CM

## 2013-01-18 DIAGNOSIS — J439 Emphysema, unspecified: Secondary | ICD-10-CM

## 2013-01-18 DIAGNOSIS — R911 Solitary pulmonary nodule: Secondary | ICD-10-CM

## 2013-01-18 NOTE — Assessment & Plan Note (Signed)
#  Left upper lobe nodule x 2  - liely lung cancer  - you wil likely need biopsy of hip area - and if this does not show cancer you likely need your left upper lobe removed - my CMA will send note to your primary care doctor - refer you to pulmonary clinic and thoracic surgery clinic at [facility] asap    (> 50% of this 15 min visit spent in face to face counseling)

## 2013-01-18 NOTE — Telephone Encounter (Signed)
ensyure  1. His PCP gets note 2. He has referral to [facility] 3. He has referral to [facility] THor[facility] surgery   Dr. Kalman Shan, M.D., [facility].C.P Pulmonary and Critical Care Medicine Staff Physician [facility] Pager: [phone], If no answer or between  15:00h - 7:00h: call [phone]  01/18/2013 6:40 PM

## 2013-01-23 ENCOUNTER — Encounter: Payer: Self-pay | Admitting: *Deleted

## 2013-01-23 NOTE — Telephone Encounter (Signed)
Appts have been set for [facility]. Note faxed to PCP. Carron Curie, CMA

## 2013-01-26 ENCOUNTER — Encounter: Payer: Self-pay | Admitting: *Deleted

## 2013-01-26 ENCOUNTER — Encounter: Payer: Self-pay | Admitting: Cardiology

## 2013-01-27 ENCOUNTER — Encounter: Payer: Medicare Other | Admitting: Cardiology

## 2013-08-23 ENCOUNTER — Observation Stay: Payer: Self-pay | Admitting: Internal Medicine

## 2013-08-23 LAB — BASIC METABOLIC PANEL
ANION GAP: 10 (ref 7–16)
BUN: 14 mg/dL (ref 7–18)
CO2: 26 mmol/L (ref 21–32)
Calcium, Total: 8.7 mg/dL (ref 8.5–10.1)
Chloride: 104 mmol/L (ref 98–107)
Creatinine: 1.1 mg/dL (ref 0.60–1.30)
EGFR (African American): >60
EGFR (Non-African Amer.): >60
GLUCOSE: 123 mg/dL — AB (ref 65–99)
Osmolality: 281 (ref 275–301)
Potassium: 3.3 mmol/L — ABNORMAL LOW (ref 3.5–5.1)
SODIUM: 140 mmol/L (ref 136–145)

## 2013-08-23 LAB — CBC
HCT: 26.5 % — ABNORMAL LOW (ref 40.0–52.0)
HGB: 8.3 g/dL — ABNORMAL LOW (ref 13.0–18.0)
MCH: 23.2 pg — ABNORMAL LOW (ref 26.0–34.0)
MCHC: 31.2 g/dL — AB (ref 32.0–36.0)
MCV: 74 fL — AB (ref 80–100)
PLATELETS: 192 10*3/uL (ref 150–440)
RBC: 3.57 10*6/uL — ABNORMAL LOW (ref 4.40–5.90)
RDW: 30.5 % — ABNORMAL HIGH (ref 11.5–14.5)
WBC: 3.1 10*3/uL — ABNORMAL LOW (ref 3.8–10.6)

## 2013-08-23 LAB — APTT
Activated PTT: 31.3 secs (ref 23.6–35.9)
Activated PTT: 49.5 secs — ABNORMAL HIGH (ref 23.6–35.9)

## 2013-08-23 LAB — PROTIME-INR
INR: 1.1
Prothrombin Time: 13.8 secs (ref 11.5–14.7)

## 2013-08-23 LAB — HEPATIC FUNCTION PANEL A (ARMC)
Albumin: 2.7 g/dL — ABNORMAL LOW (ref 3.4–5.0)
Alkaline Phosphatase: 95 U/L
BILIRUBIN TOTAL: 0.5 mg/dL (ref 0.2–1.0)
Bilirubin, Direct: 0.1 mg/dL (ref 0.00–0.20)
SGOT(AST): 23 U/L (ref 15–37)
SGPT (ALT): 29 U/L (ref 12–78)
Total Protein: 7 g/dL (ref 6.4–8.2)

## 2013-08-23 LAB — TROPONIN I
TROPONIN-I: 0.1 ng/mL — AB
Troponin-I: 0.07 ng/mL — ABNORMAL HIGH

## 2013-08-23 LAB — MAGNESIUM: Magnesium: 1.4 mg/dL — ABNORMAL LOW

## 2013-08-24 LAB — CBC WITH DIFFERENTIAL/PLATELET
BASOS ABS: 0 10*3/uL (ref 0.0–0.1)
BASOS PCT: 0.2 %
EOS ABS: 0 10*3/uL (ref 0.0–0.7)
Eosinophil %: 0.3 %
HCT: 23.3 % — ABNORMAL LOW (ref 40.0–52.0)
HGB: 7.6 g/dL — ABNORMAL LOW (ref 13.0–18.0)
LYMPHS PCT: 49.5 %
Lymphocyte #: 1.5 10*3/uL (ref 1.0–3.6)
MCH: 24.1 pg — ABNORMAL LOW (ref 26.0–34.0)
MCHC: 32.6 g/dL (ref 32.0–36.0)
MCV: 74 fL — ABNORMAL LOW (ref 80–100)
MONO ABS: 0.1 x10 3/mm — AB (ref 0.2–1.0)
MONOS PCT: 4.4 %
Neutrophil #: 1.4 10*3/uL (ref 1.4–6.5)
Neutrophil %: 45.6 %
PLATELETS: 178 10*3/uL (ref 150–440)
RBC: 3.15 10*6/uL — AB (ref 4.40–5.90)
RDW: 30.5 % — AB (ref 11.5–14.5)
WBC: 3 10*3/uL — ABNORMAL LOW (ref 3.8–10.6)

## 2013-08-24 LAB — BASIC METABOLIC PANEL
ANION GAP: 7 (ref 7–16)
BUN: 13 mg/dL (ref 7–18)
CALCIUM: 8.5 mg/dL (ref 8.5–10.1)
CHLORIDE: 103 mmol/L (ref 98–107)
CO2: 27 mmol/L (ref 21–32)
Creatinine: 0.99 mg/dL (ref 0.60–1.30)
EGFR (African American): >60
EGFR (Non-African Amer.): >60
Glucose: 122 mg/dL — ABNORMAL HIGH (ref 65–99)
OSMOLALITY: 275 (ref 275–301)
POTASSIUM: 3.4 mmol/L — AB (ref 3.5–5.1)
SODIUM: 137 mmol/L (ref 136–145)

## 2013-08-24 LAB — TSH: THYROID STIMULATING HORM: 2.17 u[IU]/mL

## 2013-12-24 ENCOUNTER — Emergency Department: Payer: Self-pay | Admitting: Emergency Medicine

## 2013-12-24 LAB — URINALYSIS, COMPLETE
BACTERIA: NONE SEEN
BILIRUBIN, UR: NEGATIVE
BLOOD: NEGATIVE
Glucose,UR: NEGATIVE mg/dL (ref 0–75)
Ketone: NEGATIVE
Leukocyte Esterase: NEGATIVE
NITRITE: NEGATIVE
PH: 5 (ref 4.5–8.0)
PROTEIN: NEGATIVE
RBC,UR: 5 /HPF (ref 0–5)
SQUAMOUS EPITHELIAL: NONE SEEN
Specific Gravity: 1.019 (ref 1.003–1.030)
WBC UR: 2 /HPF (ref 0–5)

## 2014-01-28 DEATH — deceased

## 2014-08-20 NOTE — Consult Note (Signed)
PATIENT NAME:  Randy Mcgee, Randy Mcgee MR#:  [id number] DATE OF BIRTH:  April 12, 1941  DATE OF CONSULTATION:  01/10/2013  REFERRING PHYSICIAN:   CONSULTING PHYSICIAN:  Manya Silvas, MD  HISTORY OF PRESENT ILLNESS:  The patient is a 74 year old black male who was diagnosed with an atrial fibrillation and pulmonary embolus in [place] at [facility]. This was done on 12/31/2012. He was placed on diltiazem and Xarelto and discharged on the 5th. He noted black, tarry stools without any other symptoms and came to the ER where he was found to be slightly anemic with a hemoglobin of 11.7 and was admitted to the hospital for GI bleeding on Xarelto.   The patient's last dose of Xarelto was 2:00 p.m. yesterday.   REVIEW OF SYSTEMS:  He denies any fever. He did have a spell of having a sweatiness and fell out of work and was seen in the ER and that is when he was found to have atrial fibrillation. He was then found to have a PE. Currently he denies any coughing or shortness of breath and no chest pain or palpitations and he feels like his heart back in regular rhythm. No nausea or vomiting and no abdominal pain. He does have heartburn but he eats and lies down after work.   HABITS:  He quit smoking three years ago. He smoked 1/2 pack a day. No alcohol in 30 years.   PAST SURGICAL HISTORY:  He had back surgery 40 years ago   MEDICATIONS: 1.  Aspirin 81 mg a day.  2.  Lisinopril 10 mg a day.  3.  Diltiazem 240 mg half a tablet a day. 4.  Inderal 15 mg b.i.d.   PHYSICAL EXAMINATION:  VITAL SIGNS:  Blood pressure 150/84, temp 98.2, pulse 80, respirations 18. GENERAL:  Black male in no acute distress  HEENT:  Sclerae anicteric. Conjunctivae negative. Tongue negative.  HEAD:  Atraumatic.  CHEST:  Clear.  HEART:  No murmurs or gallops and appears to be regular rhythm now.  ABDOMEN:  Nontender. No hepatosplenomegaly. No masses. No bruits.   PSYCH: Mood and affect are appropriate. The patient is awake, alert,  and oriented.  Hemoglobin this morning is 12.1; yesterday it was 11.7.   LABORATORY, DIAGNOSTIC, AND RADIOLOGICAL DATA:  Platelet count is 247, white count 12,000, B+ blood negative antibody screen. PT of 23.2, INR 2.1 yesterday. Urinalysis is unremarkable.   ASSESSMENT:  A patient with atrial fibrillation and a pulmonary embolus who was placed on Xarelto who had melena from the Xarelto. It would be important and helpful to know what he bled from. He has not had a Xarelto in 24 hours. Therefore his INR should be lower than yesterday. It would be important to know what the bleeding source was so he can be placed on either reduce dose of Xarelto or aspirin because of his PE related to atrial fibrillation. He is now in sinus rhythm on diltiazem, and therefore his chance of further pulmonary embolism may be diminished but it would be probably helpful to have a period of anticoagulation in case any other small clots are left in the atrium of the heart.   PLAN:  The plan is to do an upper endoscopy this afternoon and then make disposition based on the endoscopic findings.  ____________________________ Manya Silvas, MD rte:jm D: 01/10/2013 11:38:53 ET T: 01/10/2013 11:56:45 ET JOB#: [id number]  cc: Manya Silvas, MD, <Dictator> Aaron Mose. Hower, MD [facility]. Jacqualine Code, MD Manya Silvas MD  ELECTRONICALLY SIGNED 01/31/2013 15:28

## 2014-08-20 NOTE — Consult Note (Signed)
CC melena with Jennye Moccasin which is for PE.  Pt also had at fib but seems to be in sinus rhythm on exam, will get consult with Dr,. Callwood and a stat EKG.  Case discussed with Dr. Clayborn Bigness.  Electronic Signatures: Manya Silvas (MD)  (Signed on 13-Sep-14 12:02)  Authored  Last Updated: 13-Sep-14 12:02 by Manya Silvas (MD)

## 2014-08-20 NOTE — Consult Note (Signed)
PATIENT NAME:  Randy Mcgee, Randy Mcgee MR#:  [id number] DATE OF BIRTH:  Apr 07, 1941  DATE OF CONSULTATION:  01/10/2013  CONSULTING PHYSICIAN:  Dwayne D. Clayborn Bigness, MD  REFERRING PHYSICIAN:  Dr. Delman Kitten in the Emergency Room, Dr. Vira Agar, Elon Alas with [facility]  INDICATION: Atrial fibrillation. The patient also has a lower GI bleed.   HISTORY OF PRESENT ILLNESS: The patient is a 74 year old African-American male with past history of hypertension, recent diagnosis of pulmonary embolus, and atrial fibrillation earlier this month, who was treated in [place] at [facility]. He was treated with diltiazem and Xarelto for anticoagulation. Since that time, he has been doing reasonably well, taking Xarelto twice a day. He started to notice dark stools. He had some symptoms of chest pain, palpitations, shortness of breath. No syncope. He had dark tarry stools, and finally came to the Emergency Room for evaluation. His hemoglobin was found to be 11.9, not sure what his baseline was. He appeared to be hemodynamically stable.   REVIEW OF SYSTEMS:  No blackout spells or syncope. Denies nausea or vomiting. No fever, chills, sweats. No weight loss. No weight gain. No hemoptysis, hematemesis. > sputum production or cough.   FAMILY HISTORY:  Essentially negative.   SOCIAL HISTORY:  Ex-smoker. No alcohol consumption. He still works.   PAST MEDICAL HISTORY:  Pulmonary embolus, atrial fibrillation, hypertension, anemia.   ALLERGIES:  None.   MEDICATIONS:  Aspirin 81 mg a day, lisinopril 10 mg a day, diltiazem 240 mg once a day, Xarelto 50 mg twice a day, multivitamin once a day.    PHYSICAL EXAMINATION: VITAL SIGNS:  Blood pressure was 160/80, pulse 84, respiratory rate 16, afebrile.  HEENT:  Normocephalic, atraumatic. Pupils are equal on exam.  NECK:  Supple. No JVP, bruits, or adenopathy.  LUNGS:  Clear to auscultation. No significant wheeze, rhonchi, or rales.  HEART: Regular rhythm.  ABDOMEN:  Benign.   EXTREMITIES: Within normal limits.  NEUROLOGIC: Intact.  SKIN:  Normal.   LABORATORIES: Sodium is 140, potassium 3.9, chloride 108, bicarb 26, BUN 16, creatinine 1.21, glucose 100. LFTs negative. Troponin less than 0.02. EKG: Normal sinus rhythm, incomplete right bundle branch block. White count of 10.4, hemoglobin 11.7, MCV 81, platelet count 245. PT 23.   ASSESSMENT: 1.  Atrial fibrillation. 2.  Lower gastrointestinal bleed. 3.  Anemia. 4.  History of pulmonary embolus.  5.  Hypertension.   PLAN:  Continue current medications. I would hold Xarelto for now. Would discontinue aspirin. Continue telemetry. I would continue diltiazem for rate control and blood pressure control. Would recommend continue scope to evaluate for source of bleeding. Would probably not biopsy because he has not been off his Xarelto long enough. He will need to be off for at least 4 to 5 days. Once the patient is evaluated and ready to go home, will probably restart Xarelto in 24 to 48 hours, but will reduce the dose to 15 mg once a day instead of twice a day. We will not resume aspirin for now. Continue blood pressure monitoring. Continue current therapy. Will not proceed with cardiac workup for now. Will try to get the records from [facility], and treat the patient medically for now. The patient is an acceptable scope risk right now, from a cardiac standpoint.    ____________________________ Loran Senters. Clayborn Bigness, MD ddc:mr D: 01/10/2013 19:36:03 ET T: 01/10/2013 20:15:21 ET JOB#: [id number]  cc: Dwayne D. Clayborn Bigness, MD, <Dictator> Yolonda Kida MD ELECTRONICALLY SIGNED 01/26/2013 10:15

## 2014-08-20 NOTE — Consult Note (Signed)
Brief Consult Note: Diagnosis: AFIB/Hx PE/GI bleed with anemia.   Patient was seen by consultant.   Consult note dictated.   Recommend to proceed with surgery or procedure.   Orders entered.   Discussed with Attending MD.   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 PLAN Tele Continue dilt for rate control Hold Xarelto until after scope D/C ASA Continue Bp control F/U H/H Maintain Bp meds He is an acceptable scope risk Would probably not bx until off Xarelto for 4-5 days F/U cardiology as outpt 1-2 weeks Continue TEDs while off Xarelto.  Electronic Signatures: Lujean Amel D (MD)  (Signed 13-Sep-14 13:35)  Authored: Brief Consult Note   Last Updated: 13-Sep-14 13:35 by Yolonda Kida (MD)

## 2014-08-20 NOTE — Consult Note (Signed)
PATIENT NAME:  Randy Mcgee, Randy Mcgee MR#:  [id number] DATE OF BIRTH:  1940-08-02  DATE OF CONSULTATION:  01/09/2013  REFERRING PHYSICIAN:      Delman Kitten, MD CONSULTING PHYSICIAN:  Aaron Mose. Sadaf Przybysz, MD  PRIMARY CARE PHYSICIAN:  Nonlocal, out of [place].  HISTORY OF PRESENT ILLNESS:  Mr. Creger is a 74 year old gentleman with past medical history of hypertension who was recently diagnosed with a pulmonary embolus and atrial fibrillation on 12/31/2012 in [place] at [place]. He was placed on diltiazem as well as Xarelto and discharged on the 5th. Since that time, he has been feeling well, however, he has been having 1 to 2 day duration of black, tarry stools without associated symptoms, including chest pain, palpitations, shortness of breath, nausea, vomiting, abdominal pain, syncope or presyncope, who presented to the Emergency Department at concern of his physician when mentioning having dark, tarry stools while on blood thinners.  While here in the Emergency Department, basic labs were obtained, found to be anemic at 11.7 without a known baseline. Otherwise, he had no further complaints.    REVIEW OF SYSTEMS: CONSTITUTIONAL: Denies fevers, fatigue, weakness.  EYES: Denies blurred vision or pain.  ENT: Denies dysphagia or hearing loss. RESPIRATORY: Denies cough or shortness of breath.  CARDIOVASCULAR: Denies chest pain or palpitations.  GASTROINTESTINAL: Denies nausea, vomiting, abdominal pain, however, does mention melena.  GENITOURINARY: Denies dysuria or hematuria.  ENDOCRINE: Denies nocturia or thyroid problems.  HEMATOLOGIC/LYMPHATIC: Bleeding as above. Denies easy bruising.  SKIN: Denies rash or lesion.  MUSCULOSKELETAL: Denies back or shoulder pain. NEUROLOGICAL: Denies any paralysis or paresthesias.  PSYCHIATRIC: Denies anxiety or depressive symptoms.   PAST MEDICAL HISTORY: Of recently diagnosed PE as well as atrial fibrillation on 12/31/2012 [facility] in [place], and also has a history  of hypertension.   SOCIAL HISTORY: He is an ex-tobacco user, denies any alcohol or drug use.   FAMILY HISTORY: Denies any history of blood clots or GI bleed to his knowledge.   ALLERGIES: No known drug allergies.   MEDICATIONS:  Aspirin 81 mg p.o. daily, lisinopril 10 mg p.o. daily, diltiazem extended release 240 mg half-tab p.o. daily, Xarelto 15 mg p.o. b.i.d., as well as a multivitamin once daily.  PHYSICAL EXAMINATION: VITAL SIGNS: Temperature 98.7, heart rate 84, sinus rhythm, respirations 18, blood pressure 216/103, pulse oximetry 96 on room air.  GENERAL: No acute distress, awake, alert and oriented x 3.  HEENT: Normocephalic, atraumatic. Extraocular muscles intact. Pupils equal, react to light as well as accommodation.  CARDIOVASCULAR: S1, S2, regular rate and rhythm. No murmurs, rubs or gallops.  PULMONARY: Clear to auscultation bilaterally without wheezes, rhonchi.  ABDOMEN: Soft, nontender, nondistended, positive bowel sounds.  NEUROLOGIC: Cranial nerves II through XII intact. No gross neurological deficits.   LABORATORY DATA: Sodium 140, potassium 3.9, chloride 108, bicarbonate 26, BUN 16, creatinine 1.21, glucose 100, LFTs within normal limits. Troponin I less than 0.02.   EKG normal sinus rhythm with incomplete right bundle branch   WBCs 10.4, hemoglobin 11.7, MCV 81, RDW of 15, platelets of 245, PT of 23.2, INR 2.1. Urinalysis within normal limits.   ASSESSMENT AND PLAN:  Mr. Gaffey is a 74 year old gentleman recently diagnosed with a pulmonary embolus as well as atrial fibrillation, currently he is in normal sinus rhythm. He is presenting after repeated bouts of melena.  1.  Gastrointestinal bleed with melena.  2.  Recent pulmonary embolus.  3. Hypertension.  After discussing at length with Mr. Moldovan and he has decided to  leave the hospital  [place]. I have cautioned him that his bleeding may worsen, he may also have an worsening clot burden;  both of  these conditions could lead to his untimely death. He understands the risks and is deciding to leave the hospital from the Emergency Department. I have advised him not to take his rivaroxaban and to call his primary care physician in the morning. I have also advised him to return to the hospital if any symptoms return, including further continued bleeding or shortness of breath or chest pain.   Total time spent counseling Mr. Menz was approximately 55 minutes.   Once again, he is leaving [place] from the Emergency Department   ____________________________ Aaron Mose. Shreyas Piatkowski, MD dkh:nts D: 01/09/2013 22:37:06 ET T: 01/09/2013 23:08:37 ET JOB#: [id number]  cc: Aaron Mose. Gill Delrossi, MD, <Dictator> Lavonne Cass Woodfin Ganja MD ELECTRONICALLY SIGNED 01/10/2013 21:17

## 2014-08-20 NOTE — Discharge Summary (Signed)
PATIENT NAME:  Randy Mcgee, Randy Mcgee MR#:  [id number] DATE OF BIRTH:  1941/03/20  DATE OF ADMISSION:  01/10/2013 DATE OF DISCHARGE:  01/10/2013   CONSULTANTS: Dr. Clayborn Bigness from cardiology, Dr. Vira Agar from GI.   PRIMARY CARE PHYSICIAN: Dr. Tora Duck at [facility].   CHIEF COMPLAINT: Melena.   DISCHARGE DIAGNOSES: 1.  Melena, possibly from upper gastrointestinal bleed, resolved.  2.  Upper endoscopy showing gastritis and some duodenitis with normal esophagus and no bleeding, small hiatal hernia present.  3.  History of recently diagnosed pulmonary embolus.   4.  Atrial fibrillation.  5.  Hypertension.  DISCHARGE MEDICATIONS: 1.  Xarelto 50 mg daily starting tomorrow.  2.  Multivitamin with minerals once a day.  3.  Lisinopril 10 mg daily.  4.  Omeprazole 20 mg daily.  5.  Diltiazem 120 mg extended-release one tablet once a day.   DIET: Low sodium.   ACTIVITY: As tolerated.   FOLLOW UP:  Please follow with PCP early next week for a CBC check. If any further dark bloody stools or bleeding stop Xarelto and call your doctor right away.   DISPOSITION: Home.   CODE STATUS: Full code.   HISTORY OF PRESENT ILLNESS AND HOSPITAL COURSE: For full details of arrival, Please see the dictation by Dr. Lavetta Nielsen; but, briefly, this is a 74 year old male with recent admission and discovery of PE, in atrial fibrillation who was started on Xarelto at [facility]. He came into the ER after being referred. He has been having dark tarry stools without symptoms for the past couple of days before admission.  He initially did not want to stay in the hospital. He wanted to walk out [place] and the risk of this was explained to him. However, he decided to stay. The following day he again wanted to leave because he stated that he has small children, that  he does not have anybody to take care of, but for now, his daughter came [place] to take care of them. There is a 72 year old and a 72 year old per him. Here,  he did not have any acute bleeding and Xarelto was held. Initial hemoglobin was noted to be 11.7 and the next one came back at 11.8. Today, this morning, the hemoglobin was at 12.1. He was seen by GI, Dr. Vira Agar. The case was also discussed with Dr. Clayborn Bigness. He was taken to endoscopy suite after Dr. Clayborn Bigness from cardiology stated he is an acceptable bleeding risk. During endoscopy, he was not noted to have any acute bleeding and the above findings were made. He will be discharged on omeprazole and Xarelto,  a lower dose, at 15 mg per day per Dr. Clayborn Bigness, who I discussed the case with as well as Dr. Vira Agar. He is very eager to go and, at this point, he will be discharged, and he is aware that he should follow with his PCP and check another CBC count.   PHYSICAL EXAMINATION: VITAL SIGNS:  On the day of admission, his vitals are as follows: Temperature 98.1, pulse rate 73, respiratory rate 18, blood pressure 132/79, O2 sat 97% on room air.  GENERAL: The patient is a well-developed male lying in bed, in no obvious distress.  HEENT:  Head normocephalic, atraumatic. Poor dentition.  NECK: Supple. CARDIOVASCULAR: Regular rate and rhythm.  ABDOMEN: Soft, nontender, nondistended.  EXTREMITIES: Show no significant lower extremity edema. At this point, he will be discharged to outpatient follow up.   ____________________________ Vivien Presto, MD sa:nts D: 01/10/2013 15:52:07 ET  T: 01/11/2013 00:37:44 ET JOB#: [id number]  cc: Vivien Presto, MD, <Dictator> Tora Duck, M.D. AT [facility] MD ELECTRONICALLY SIGNED 01/20/2013 14:03

## 2014-08-21 NOTE — Discharge Summary (Signed)
PATIENT NAME:  Randy Mcgee, Randy Mcgee MR#:  [id number] DATE OF BIRTH:  1941-03-27  DATE OF ADMISSION:  08/23/2013 DATE OF DISCHARGE:  08/24/2013  ADMISSION DIAGNOSIS: Atrial fibrillation/rapid ventricular response   DISCHARGE DIAGNOSES: 1. Atrial fibrillation and rapid ventricular response, resolved.  2. History of gastrointestinal bleed.  3. History of lung cancer, currently on chemotherapy.  4. Anemia, likely chronic in nature.  5. Elevated troponins from demand ischemia.  6. Hypokalemia.   CONSULTATIONS: None.   PERTINENT LABORATORIES: White blood cells 3, hemoglobin 7.6, hematocrit 27, platelets 178.   HOSPITAL COURSE: This is a very pleasant 74 year old male who is actively receiving chemotherapy for his lung cancer with a history of atrial fibrillation, who was taken off his medications by his oncologists for GI bleed, who presented with AFib and RVR. For further details, please refer to the H and P.  1. Atrial fibrillation. The patient received adenosine and diltiazem. He was converted to normal sinus rhythm, within normal sinus rhythm throughout his hospitalization.  He had been on anticoagulation in the past, but due to anemia/GI bleed in March, he cannot take anticoagulation and is only on aspirin. This is per the patient's history. I discussed that he needs to follow up with his cardiologist regarding this issue as well. He was restarted on diltiazem, apparently this was also taken off due to maybe some questionable low blood pressures in the past. His blood pressure was fine here and did need actually a medication for his blood pressure as well. He will continue on diltiazem, and Xarelto will be held for now.  2. Anemia likely from his chemo/cancer. No evidence of acute gastrointestinal  blood loss. He will follow up with his oncologist in 1 to 2 days.  3. Lung cancer, currently on chemotherapy at [facility]. 4. Elevated troponins demand ischemia from tachycardia not acute coronary syndrome.   5. Hypokalemia, which was repleted.   DISCHARGE MEDICATIONS: 1. MiraLAX at bedtime.  2. Multivitamin 1 tablet daily.  3. Omeprazole 20 mg daily.  4.  Compazine10 mg q.8 hours p.r.n.  5. Lorazepam 0.5 mg q.6h.  6. Aspirin 81 mg daily.  7. Ferrous sulfate 1 tablet daily, that is 30 mg.  8. Diltiazem 240 mg daily.   DISCHARGE DIET: Low sodium.   DISCHARGE ACTIVITY: As tolerated.   DISCHARGE FOLLOWUP: The patient will follow up in 1 to 2 days with [facility], in 1 to 2 days [facility]. The patient was medically stable for discharge.   TIME SPENT: 35 minutes.   ____________________________ Donell Beers. Benjie Karvonen, MD spm:sg D: 08/24/2013 11:28:00 ET T: 08/24/2013 11:50:22 ET JOB#: [id number]  cc: Jessalynn Mccowan P. Benjie Karvonen, MD, <Dictator> Donell Beers Shanita Kanan MD ELECTRONICALLY SIGNED 08/25/2013 20:26

## 2014-08-21 NOTE — H&P (Signed)
PATIENT NAME:  Randy Mcgee, Randy Mcgee MR#:  [id number] DATE OF BIRTH:  Nov 23, 1940  DATE OF ADMISSION:  08/23/2013  PRIMARY CARE PHYSICIAN: Materials engineer at [facility].  REFERRING EMERGENCY ROOM PHYSICIAN: Dr. Ferman Hamming   CHIEF COMPLAINT: Syncopal episode.   HISTORY OF PRESENT ILLNESS: The patient is a 74 year old male who has past history of atrial fibrillation, was taking Cardizem, lisinopril, Xarelto. Diagnosed with metastatic lung cancer almost 2 months ago and started on chemotherapy. Received 7 repeat cycles so far.  As per him, when he was started chemotherapy after 1 or 2 cycles when he was in  cancer center, he felt somewhat dizzy and almost fell down so they checked the blood pressure.  It was on the lower side and so cancer center doctor told him to stop taking his blood pressure and afib  medication as they suggested his blood pressure was dropping because of that, and so, since then, he is not taking any afib medication, including Cardizem, Xarelto, or lisinopril.  Today, when he was having his breakfast he passed out and so family called EMS. When EMS arrived, his heart rate was found around 200. They gave adenosine injections twice and brought him to Emergency Room. In the ER, Dr. Benjaman Lobe also noticed his heart rate to be 130-140 with afib and so gave Cardizem injection one time, which converted him to normal sinus rhythm, and currently during my examination, his heart rate is normal sinus rhythm around 90s. The patient is without any complaint. On further questioning, he denies any chest pain, shortness of breath, or feeling dizzy currently.  Hospitalist service was contacted for further evaluation after ER physician started on heparin IV drip for his afib disorder.  REVIEW OF SYSTEMS:  CONSTITUTIONAL: Negative for fever, fatigue, weakness, pain, or weight loss.  EYES: No blurring, double vision, discharge, or redness.  EARS, NOSE, THROAT: No tinnitus, ear pain, or hearing loss.  RESPIRATORY: No  cough, wheezing, hemoptysis, or shortness of breath.  CARDIOVASCULAR: No chest pain, orthopnea, edema, arrhythmia, but had an episode of single syncope .  GASTROINTESTINAL: No nausea, vomiting, diarrhea, abdominal pain.  GENITOURINARY: Dysuria, hematuria, or increased frequency.  ENDOCRINE: No heat or cold intolerance. No excessive sweating.  SKIN: No acne, rashes, or lesions on the skin.  MUSCULOSKELETAL: No pain or swelling in the joints.  NEUROLOGICAL: No numbness, weakness, tremor, or vertigo, but had an episode of passing out today. PSYCHIATRY: No anxiety, insomnia, bipolar disorder.   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   SOCIAL HISTORY: He is an [employer] manager. Denies any alcohol or drug use. Lives with family.   FAMILY HISTORY: Denies any history of blood clots or GI bleed.   HOME MEDICATIONS: Currently, omeprazole 20 mg oral once a day, multivitamin 1 tablet once a day, iron tablets once a day, promethazine 10 mg once a day.   PHYSICAL EXAMINATION:  VITAL SIGNS: In the ER, temperature 98, pulse 176, respirations 20, and blood pressure 110/78. Currently, during my examination, heart rate is 101, respirations 20, and blood pressure 148/75 with 99% on room air.  GENERAL: The patient is fully alert and oriented to time, place, and person. Does not appear in any acute distress.  HEENT: Head and neck atraumatic. Conjunctivae pink. Oral mucosa moist.  NECK: Supple. No JVD.  RESPIRATORY: Bilateral clear and equal air entry.  CARDIOVASCULAR: S1, S2 present, regular. No murmur.  ABDOMEN: Soft, nontender. Bowel sounds present. No organomegaly.  SKIN: No rashes.  LEGS:  No edema.  NEUROLOGICAL: Power 5/5. Follows commands. Moves all 4 limbs. No gross abnormality.  PSYCHIATRIC: Does not appear in any acute psychiatric illness.  IMPORTANT LABORATORY RESULTS: Glucose 123, BUN 14, creatinine 1.10, sodium 140, potassium 3.3, chloride  104, CO2 of 26, calcium 8.7, magnesium is 1.4. Total protein 7.0, bilirubin 0.5, alkaline phosphate 95, SGOT 23, and SGPT 29. Troponin less than 0.02. WBC 3.1, hemoglobin 8.3, platelet count is 192,000, and MCV 74. Prothrombin time is 13.8. INR is 1.1.   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  Syncopal episode disease. This is due to arrhythmia. We will monitor him on telemetry. We will check troponins and get cardiology evaluation. We will also get echocardiogram.  Atrial fibrillation with rapid ventricular response. Already responded to Cardizem injection and he is now in normal sinus rhythm. Restarting 1 Cardizem long-acting 240 mg oral tablet.  Give first dose today.  Explained to him about continuing taking his medications and do not stop without talking to his cardiologist.  ER physician started on heparin IV drip because of his atrial fibrillation episode as he was taken off Xarelto. I would leave it up to cardiologist to decide whether to start him back on Xarelto or to discuss with his cardiologist.  Hypertension. Currently, blood pressure is under control so would like to just monitor at this time.  Hypokalemia. We will replace orally and recheck tomorrow.  Hypomagnesemia. We will replace IV and we will recheck tomorrow.   CODE STATUS: Full code.   TOTAL TIME SPENT ON THIS ADMISSION: 50 minutes.    ____________________________ Ceasar Lund Anselm Jungling, MD vgv:dd D: 08/23/2013 16:05:43 ET T: 08/23/2013 18:39:03 ET JOB#: [id number]  cc: Ceasar Lund. Anselm Jungling, MD, <Dictator> Vaughan Basta MD ELECTRONICALLY SIGNED 08/25/2013 12:06

## 2015-03-31 IMAGING — PT NM PET TUM IMG INITIAL (PI) SKULL BASE T - THIGH
1 of 6 series · 1 of 25 positions shown · non-contrast
Comparison: Chest abdomen and pelvic CTs of 12/31/2012.

CLINICAL DATA: Initial treatment strategy for pulmonary nodules..

EXAM:
NUCLEAR MEDICINE PET SKULL BASE TO THIGH
FASTING BLOOD GLUCOSE:  Value:  100 mg/dl
TECHNIQUE: 16.5 mCi F-18 FDG was injected intravenously. CT data was obtained
and used for attenuation correction and anatomic localization only.
(This was not acquired as a diagnostic CT examination.) Additional
exam technical data entered on technologist worksheet.

[Series 2: ct images · axial · 3.8mm · 0.98mm/px · 1 of 267 slices shown]
[im 267/267  brain]
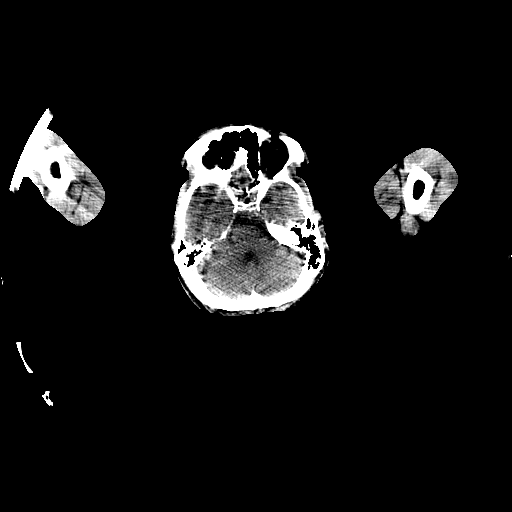

[1 of 25 positions shown; findings below may reference images not displayed]

FINDINGS: NECK

No areas of hypermetabolism.

CHEST

Hypermetabolic left axillary nodes. These measure up to 1.4 cm and a
S.U.V. max of 6.3, including on image 61/series 2. These maintain
their fatty hila. A mildly hypermetabolic left subpectoral node is
also identified.

Spiculated left upper lobe centrally cavitary pulmonary nodule which
measures 2.4 cm and a S.U.V. max of 17.6 on image 83/ series 2.

A lingular segment left upper lobe pulmonary nodule/mass measures
2.9 x 2.8 cm and a S.U.V. max of 13.5 on image 93/ series 2.

No abnormal activity within mediastinal or hilar lymph nodes.

ABDOMEN/PELVIS

No areas of abnormal hypermetabolism.

SKELETON

Mildly heterogeneous FDG activity within the bony pelvis. An area
within the left iliac, subjacent to the sacroiliac joint measures
1.2 cm and a S.U.V. max of 5.2 on image 198/ series 2. This is
lucent an well-circumscribed.

More mild hypermetabolism which may correspond to subtle lucency in
the posterior right iliac on image 185/series 2. Measures a S.U.V.
max of 4.7.

CT IMAGES PERFORMED FOR ATTENUATION CORRECTION DEMONSTRATE

Lipoma within the musculature of the posterior low left-sided neck.
No other significant findings within the back.

Chest, abdomen, and pelvic findings deferred to recent diagnostic
CTs. No acute superimposed process. Cholelithiasis. Low-density left
renal lesion which is likely a cyst. Non aneurysmal dilatation of
the infrarenal abdominal aorta. Bilateral fat containing inguinal
hernias. Scattered sclerotic lesions within the bony pelvis,
separate from the areas of hyper metabolism.
IMPRESSION: 1. Two left upper lobe spiculated lung lesions, most consistent with
synchronous primary bronchogenic carcinomas.
2. Hypermetabolic left axillary nodes. Favored to be
benign/reactive. Not in the typical distribution of primary
bronchogenic carcinoma metastasis and not suspicious in CT
morphology.
3. Heterogeneous pelvic marrow metabolism with 2 foci of more focal
hypermetabolism. Neither have typical CT findings of metastatic
disease. However, given the extent of hypermetabolism of the
subchondral left iliac lesion, tissue sampling should be considered.
4. Incidental findings, including cholelithiasis.

## 2015-11-13 IMAGING — CR DG CHEST 1V PORT
1 series · 1 of 1 positions shown · non-contrast
Comparison: NM PET IMAGE INITIAL (PI) SKULL BASE TO THIGH dated
01/08/2013;

CLINICAL DATA: History of lung cancer, finishing 6 weeks of chemo,
former smoker, history of atrial fibrillation

EXAM:
PORTABLE CHEST - 1 VIEW

[ap]
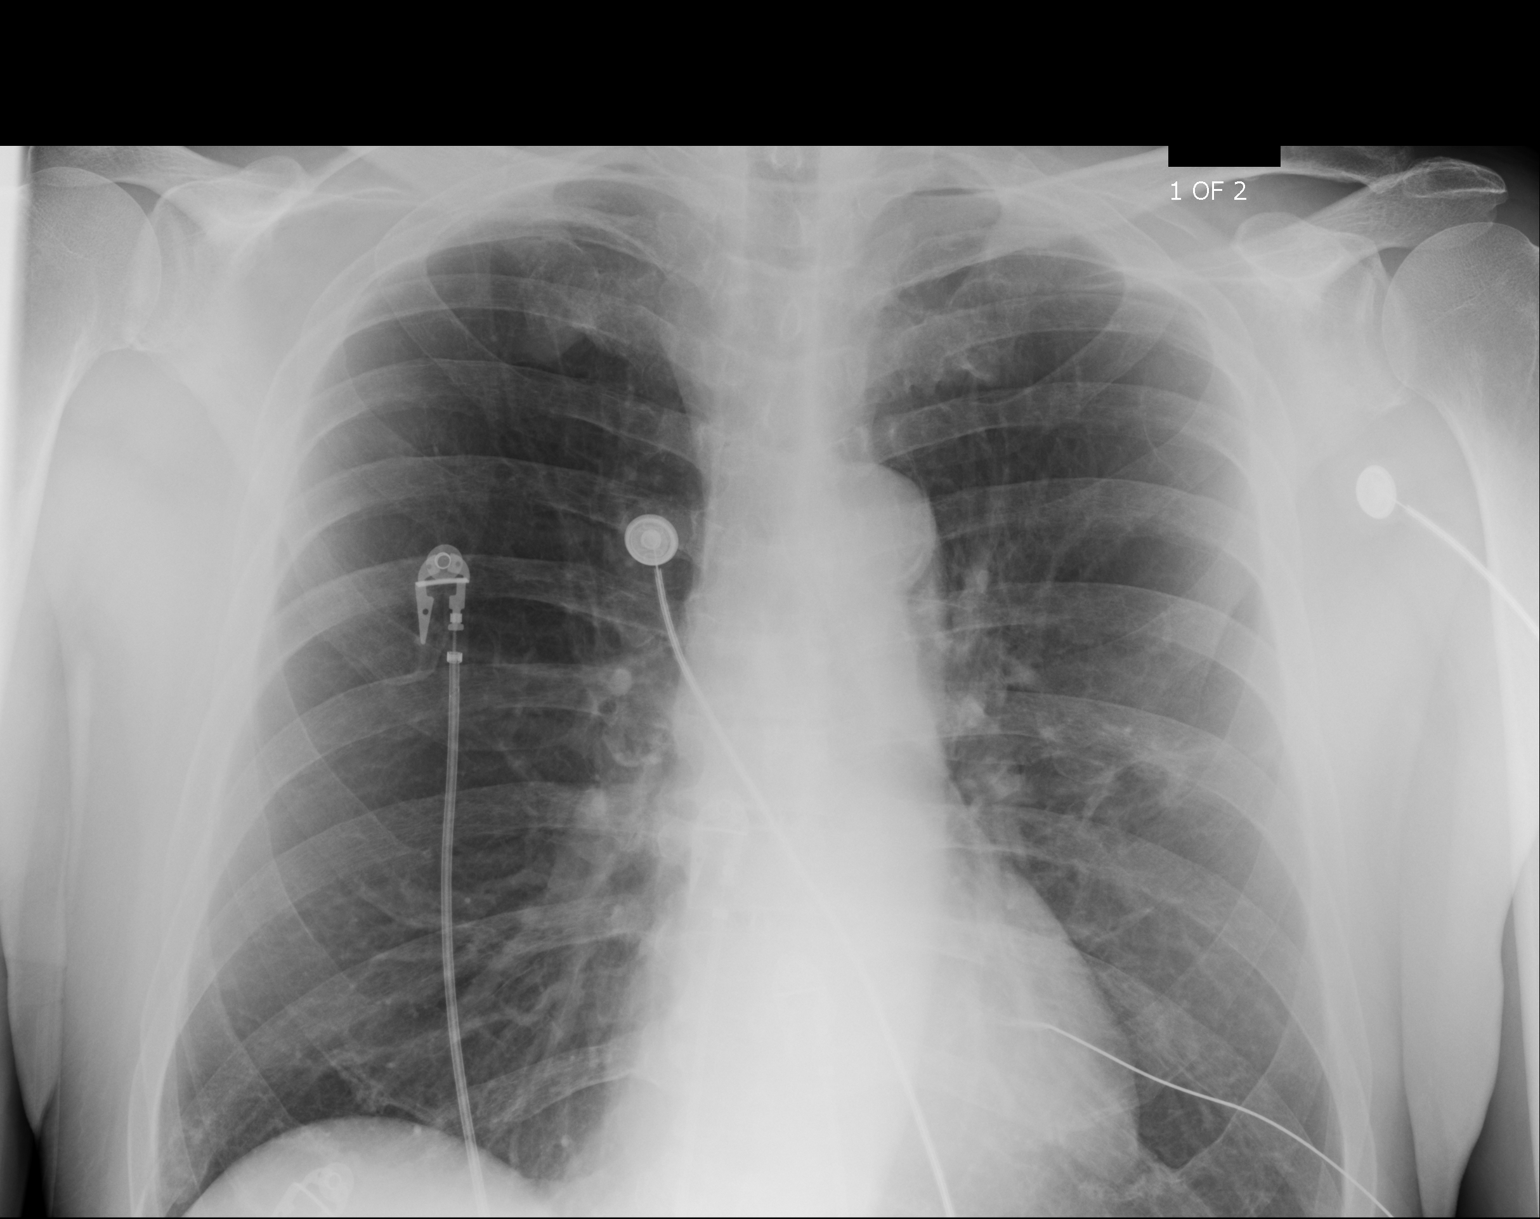

[1 of 1 positions shown; findings below may reference images not displayed]

DG CHEST 2 VIEW dated 12/31/2012; CT ANGIO CHEST W/CM
&/OR WO/CM dated 12/31/2012
FINDINGS: Grossly unchanged cardiac silhouette and mediastinal contours with
atherosclerotic plaque within the thoracic aorta. The known
spiculated nodule within the left mid lung has minimally decreased
in size in the interval. The additional known spiculated nodule
within medial aspect of the left upper lobe is not well demonstrated
on the present examination. The lungs remain hyperexpanded with
thickening of the pulmonary interstitium. Nodular opacity overlying
the peripheral aspect of right lower lung is favored to represent a
nipple shadow. No new focal airspace opacities. No pleural effusion
pneumothorax. No evidence of edema. No acute osseus abnormalities.
IMPRESSION: 1. Hyperexpanded lungs without acute cardiopulmonary disease.
2. Known spiculated nodule with the left mid lung appears slightly
smaller on the present examination - findings possibly suggestive of
treatment response.
